# Patient Record
Sex: Male | Born: 1975 | Race: Black or African American | Hispanic: No | Marital: Single | Smoking: Never smoker
Health system: Southern US, Community
[De-identification: ages and names within clinical notes are randomized; demographics above are authoritative.]

## PROBLEM LIST (undated history)

## (undated) DIAGNOSIS — E119 Type 2 diabetes mellitus without complications: Secondary | ICD-10-CM

## (undated) DIAGNOSIS — I10 Essential (primary) hypertension: Secondary | ICD-10-CM

---

## 2018-12-02 ENCOUNTER — Other Ambulatory Visit: Payer: Self-pay

## 2018-12-02 ENCOUNTER — Emergency Department (HOSPITAL_COMMUNITY)
Admission: EM | Admit: 2018-12-02 | Discharge: 2018-12-03 | Disposition: A | Discharge status: Home / Self Care | Payer: Self-pay | Attending: Emergency Medicine | Admitting: Emergency Medicine

## 2018-12-02 DIAGNOSIS — Z5321 Procedure and treatment not carried out due to patient leaving prior to being seen by health care provider: Secondary | ICD-10-CM | POA: Insufficient documentation

## 2018-12-02 DIAGNOSIS — R630 Anorexia: Secondary | ICD-10-CM | POA: Insufficient documentation

## 2018-12-02 LAB — CBC WITH DIFFERENTIAL/PLATELET
Abs Immature Granulocytes: 0.02 10*3/uL (ref 0.00–0.07)
Basophils Absolute: 0 10*3/uL (ref 0.0–0.1)
Basophils Relative: 0 %
Eosinophils Absolute: 0 10*3/uL (ref 0.0–0.5)
Eosinophils Relative: 0 %
HCT: 45.6 % (ref 39.0–52.0)
Hemoglobin: 15.5 g/dL (ref 13.0–17.0)
Immature Granulocytes: 0 %
Lymphocytes Relative: 38 %
Lymphs Abs: 2.5 10*3/uL (ref 0.7–4.0)
MCH: 30 pg (ref 26.0–34.0)
MCHC: 34 g/dL (ref 30.0–36.0)
MCV: 88.4 fL (ref 80.0–100.0)
Monocytes Absolute: 0.8 10*3/uL (ref 0.1–1.0)
Monocytes Relative: 11 %
Neutro Abs: 3.3 10*3/uL (ref 1.7–7.7)
Neutrophils Relative %: 51 %
Platelets: 163 10*3/uL (ref 150–400)
RBC: 5.16 MIL/uL (ref 4.22–5.81)
RDW: 11 % — ABNORMAL LOW (ref 11.5–15.5)
WBC: 6.6 10*3/uL (ref 4.0–10.5)
nRBC: 0 % (ref 0.0–0.2)

## 2018-12-02 LAB — COMPREHENSIVE METABOLIC PANEL
ALT: 49 U/L — ABNORMAL HIGH (ref 0–44)
AST: 56 U/L — ABNORMAL HIGH (ref 15–41)
Albumin: 3.4 g/dL — ABNORMAL LOW (ref 3.5–5.0)
Alkaline Phosphatase: 34 U/L — ABNORMAL LOW (ref 38–126)
Anion gap: 14 (ref 5–15)
BUN: 27 mg/dL — ABNORMAL HIGH (ref 6–20)
CO2: 22 mmol/L (ref 22–32)
Calcium: 8.7 mg/dL — ABNORMAL LOW (ref 8.9–10.3)
Chloride: 97 mmol/L — ABNORMAL LOW (ref 98–111)
Creatinine, Ser: 2.94 mg/dL — ABNORMAL HIGH (ref 0.61–1.24)
GFR calc Af Amer: 29 mL/min — ABNORMAL LOW (ref >60)
GFR calc non Af Amer: 25 mL/min — ABNORMAL LOW (ref >60)
Glucose, Bld: 422 mg/dL — ABNORMAL HIGH (ref 70–99)
Potassium: 3.7 mmol/L (ref 3.5–5.1)
Sodium: 133 mmol/L — ABNORMAL LOW (ref 135–145)
Total Bilirubin: 0.8 mg/dL (ref 0.3–1.2)
Total Protein: 7 g/dL (ref 6.5–8.1)

## 2018-12-02 MED ORDER — ACETAMINOPHEN 500 MG PO TABS
1000.0000 mg | ORAL_TABLET | Freq: Once | ORAL | Status: AC
  Administered 2018-12-02: 18:00:00 1000 mg via ORAL
  Filled 2018-12-02: qty 2

## 2018-12-02 NOTE — ED Triage Notes (Signed)
Pt here for evaluation of generalized body aches and loss of appetite x 4-5 days. Denies sick contacts.

## 2018-12-03 ENCOUNTER — Inpatient Hospital Stay (HOSPITAL_COMMUNITY)
Admission: EM | Admit: 2018-12-03 | Discharge: 2018-12-07 | DRG: 177 | Disposition: A | Discharge status: Home / Self Care | Payer: HRSA Program | Source: Ambulatory Visit | Attending: Internal Medicine | Admitting: Internal Medicine

## 2018-12-03 ENCOUNTER — Emergency Department (HOSPITAL_COMMUNITY): Payer: HRSA Program

## 2018-12-03 ENCOUNTER — Encounter (HOSPITAL_COMMUNITY): Payer: Self-pay

## 2018-12-03 ENCOUNTER — Inpatient Hospital Stay (HOSPITAL_COMMUNITY): Payer: HRSA Program

## 2018-12-03 ENCOUNTER — Ambulatory Visit
Admission: EM | Admit: 2018-12-03 | Discharge: 2018-12-03 | Disposition: A | Discharge status: Home / Self Care | Payer: Self-pay

## 2018-12-03 ENCOUNTER — Other Ambulatory Visit: Payer: Self-pay

## 2018-12-03 DIAGNOSIS — R0902 Hypoxemia: Secondary | ICD-10-CM | POA: Diagnosis present

## 2018-12-03 DIAGNOSIS — E1122 Type 2 diabetes mellitus with diabetic chronic kidney disease: Secondary | ICD-10-CM | POA: Diagnosis not present

## 2018-12-03 DIAGNOSIS — J189 Pneumonia, unspecified organism: Secondary | ICD-10-CM

## 2018-12-03 DIAGNOSIS — E1129 Type 2 diabetes mellitus with other diabetic kidney complication: Secondary | ICD-10-CM | POA: Diagnosis present

## 2018-12-03 DIAGNOSIS — Z79899 Other long term (current) drug therapy: Secondary | ICD-10-CM | POA: Diagnosis not present

## 2018-12-03 DIAGNOSIS — E669 Obesity, unspecified: Secondary | ICD-10-CM | POA: Diagnosis present

## 2018-12-03 DIAGNOSIS — I10 Essential (primary) hypertension: Secondary | ICD-10-CM

## 2018-12-03 DIAGNOSIS — Z7984 Long term (current) use of oral hypoglycemic drugs: Secondary | ICD-10-CM

## 2018-12-03 DIAGNOSIS — J1289 Other viral pneumonia: Secondary | ICD-10-CM | POA: Diagnosis present

## 2018-12-03 DIAGNOSIS — N329 Bladder disorder, unspecified: Secondary | ICD-10-CM | POA: Diagnosis present

## 2018-12-03 DIAGNOSIS — N179 Acute kidney failure, unspecified: Secondary | ICD-10-CM

## 2018-12-03 DIAGNOSIS — N182 Chronic kidney disease, stage 2 (mild): Secondary | ICD-10-CM

## 2018-12-03 DIAGNOSIS — Z6839 Body mass index (BMI) 39.0-39.9, adult: Secondary | ICD-10-CM

## 2018-12-03 DIAGNOSIS — J9601 Acute respiratory failure with hypoxia: Secondary | ICD-10-CM

## 2018-12-03 DIAGNOSIS — U071 COVID-19: Secondary | ICD-10-CM

## 2018-12-03 DIAGNOSIS — E1165 Type 2 diabetes mellitus with hyperglycemia: Secondary | ICD-10-CM | POA: Diagnosis present

## 2018-12-03 DIAGNOSIS — J1282 Pneumonia due to coronavirus disease 2019: Secondary | ICD-10-CM

## 2018-12-03 HISTORY — DX: Essential (primary) hypertension: I10

## 2018-12-03 HISTORY — DX: Type 2 diabetes mellitus without complications: E11.9

## 2018-12-03 LAB — URINALYSIS, ROUTINE W REFLEX MICROSCOPIC
Bilirubin Urine: NEGATIVE
Glucose, UA: 50 mg/dL — AB
Hgb urine dipstick: NEGATIVE
Ketones, ur: NEGATIVE mg/dL
Leukocytes,Ua: NEGATIVE
Nitrite: NEGATIVE
Protein, ur: 100 mg/dL — AB
Specific Gravity, Urine: 1.019 (ref 1.005–1.030)
pH: 5 (ref 5.0–8.0)

## 2018-12-03 LAB — COMPREHENSIVE METABOLIC PANEL
ALT: 46 U/L — ABNORMAL HIGH (ref 0–44)
AST: 70 U/L — ABNORMAL HIGH (ref 15–41)
Albumin: 3.2 g/dL — ABNORMAL LOW (ref 3.5–5.0)
Alkaline Phosphatase: 34 U/L — ABNORMAL LOW (ref 38–126)
Anion gap: 12 (ref 5–15)
BUN: 38 mg/dL — ABNORMAL HIGH (ref 6–20)
CO2: 25 mmol/L (ref 22–32)
Calcium: 8.4 mg/dL — ABNORMAL LOW (ref 8.9–10.3)
Chloride: 94 mmol/L — ABNORMAL LOW (ref 98–111)
Creatinine, Ser: 3.25 mg/dL — ABNORMAL HIGH (ref 0.61–1.24)
GFR calc Af Amer: 26 mL/min — ABNORMAL LOW (ref >60)
GFR calc non Af Amer: 22 mL/min — ABNORMAL LOW (ref >60)
Glucose, Bld: 386 mg/dL — ABNORMAL HIGH (ref 70–99)
Potassium: 4.3 mmol/L (ref 3.5–5.1)
Sodium: 131 mmol/L — ABNORMAL LOW (ref 135–145)
Total Bilirubin: 1.4 mg/dL — ABNORMAL HIGH (ref 0.3–1.2)
Total Protein: 7 g/dL (ref 6.5–8.1)

## 2018-12-03 LAB — CBC WITH DIFFERENTIAL/PLATELET
Abs Immature Granulocytes: 0.03 10*3/uL (ref 0.00–0.07)
Basophils Absolute: 0 10*3/uL (ref 0.0–0.1)
Basophils Relative: 0 %
Eosinophils Absolute: 0 10*3/uL (ref 0.0–0.5)
Eosinophils Relative: 0 %
HCT: 45 % (ref 39.0–52.0)
Hemoglobin: 15.2 g/dL (ref 13.0–17.0)
Immature Granulocytes: 0 %
Lymphocytes Relative: 28 %
Lymphs Abs: 2.1 10*3/uL (ref 0.7–4.0)
MCH: 30.3 pg (ref 26.0–34.0)
MCHC: 33.8 g/dL (ref 30.0–36.0)
MCV: 89.8 fL (ref 80.0–100.0)
Monocytes Absolute: 0.7 10*3/uL (ref 0.1–1.0)
Monocytes Relative: 9 %
Neutro Abs: 4.5 10*3/uL (ref 1.7–7.7)
Neutrophils Relative %: 63 %
Platelets: 195 10*3/uL (ref 150–400)
RBC: 5.01 MIL/uL (ref 4.22–5.81)
RDW: 11.1 % — ABNORMAL LOW (ref 11.5–15.5)
WBC: 7.3 10*3/uL (ref 4.0–10.5)
nRBC: 0 % (ref 0.0–0.2)

## 2018-12-03 LAB — POCT FASTING CBG KUC MANUAL ENTRY: POCT Glucose (KUC): 371 mg/dL — AB (ref 70–99)

## 2018-12-03 LAB — FERRITIN: Ferritin: >7500 ng/mL — ABNORMAL HIGH (ref 24–336)

## 2018-12-03 LAB — C-REACTIVE PROTEIN: CRP: 4.9 mg/dL — ABNORMAL HIGH (ref <1.0)

## 2018-12-03 LAB — CBG MONITORING, ED
Glucose-Capillary: 356 mg/dL — ABNORMAL HIGH (ref 70–99)
Glucose-Capillary: 380 mg/dL — ABNORMAL HIGH (ref 70–99)

## 2018-12-03 LAB — SODIUM, URINE, RANDOM: Sodium, Ur: <10 mmol/L

## 2018-12-03 LAB — HEMOGLOBIN A1C
Hgb A1c MFr Bld: 14 % — ABNORMAL HIGH (ref 4.8–5.6)
Mean Plasma Glucose: 355.1 mg/dL

## 2018-12-03 LAB — CREATININE, URINE, RANDOM: Creatinine, Urine: 639.01 mg/dL

## 2018-12-03 LAB — TRIGLYCERIDES: Triglycerides: 239 mg/dL — ABNORMAL HIGH (ref <150)

## 2018-12-03 LAB — POC SARS CORONAVIRUS 2 AG -  ED: SARS Coronavirus 2 Ag: POSITIVE — AB

## 2018-12-03 LAB — LACTIC ACID, PLASMA: Lactic Acid, Venous: 1.4 mmol/L (ref 0.5–1.9)

## 2018-12-03 LAB — LACTATE DEHYDROGENASE: LDH: 543 U/L — ABNORMAL HIGH (ref 98–192)

## 2018-12-03 LAB — PROCALCITONIN: Procalcitonin: 0.13 ng/mL

## 2018-12-03 MED ORDER — DEXAMETHASONE SODIUM PHOSPHATE 10 MG/ML IJ SOLN
6.0000 mg | INTRAMUSCULAR | Status: DC
  Administered 2018-12-03 – 2018-12-05 (×3): 6 mg via INTRAVENOUS
  Filled 2018-12-03 (×3): qty 1

## 2018-12-03 MED ORDER — ONDANSETRON HCL 4 MG/2ML IJ SOLN
4.0000 mg | Freq: Once | INTRAMUSCULAR | Status: AC
  Administered 2018-12-03: 4 mg via INTRAVENOUS
  Filled 2018-12-03: qty 2

## 2018-12-03 MED ORDER — INSULIN ASPART 100 UNIT/ML ~~LOC~~ SOLN
0.0000 [IU] | Freq: Every day | SUBCUTANEOUS | Status: DC
  Administered 2018-12-03: 23:00:00 5 [IU] via SUBCUTANEOUS

## 2018-12-03 MED ORDER — ACETAMINOPHEN 325 MG PO TABS
650.0000 mg | ORAL_TABLET | Freq: Four times a day (QID) | ORAL | Status: DC | PRN
  Administered 2018-12-07: 650 mg via ORAL
  Filled 2018-12-03: qty 2

## 2018-12-03 MED ORDER — INSULIN ASPART 100 UNIT/ML ~~LOC~~ SOLN: 0.0000 [IU] | Freq: Three times a day (TID) | SUBCUTANEOUS | Status: DC

## 2018-12-03 MED ORDER — ACETAMINOPHEN 500 MG PO TABS
1000.0000 mg | ORAL_TABLET | Freq: Once | ORAL | Status: AC
  Administered 2018-12-03: 19:00:00 1000 mg via ORAL
  Filled 2018-12-03: qty 2

## 2018-12-03 MED ORDER — SODIUM CHLORIDE 0.9 % IV SOLN
100.0000 mg | INTRAVENOUS | Status: AC
  Administered 2018-12-04: 100 mg via INTRAVENOUS
  Filled 2018-12-03: qty 100
  Filled 2018-12-03: qty 20

## 2018-12-03 MED ORDER — ONDANSETRON HCL 4 MG/2ML IJ SOLN: 4.0000 mg | Freq: Four times a day (QID) | INTRAMUSCULAR | Status: DC | PRN

## 2018-12-03 MED ORDER — SODIUM CHLORIDE 0.9 % IV SOLN
200.0000 mg | Freq: Once | INTRAVENOUS | Status: AC
  Administered 2018-12-03: 200 mg via INTRAVENOUS
  Filled 2018-12-03: qty 40

## 2018-12-03 MED ORDER — SODIUM CHLORIDE 0.9 % IV BOLUS
500.0000 mL | Freq: Once | INTRAVENOUS | Status: AC
  Administered 2018-12-03: 500 mL via INTRAVENOUS

## 2018-12-03 MED ORDER — SODIUM CHLORIDE 0.9 % IV SOLN: Freq: Once | INTRAVENOUS | Status: DC

## 2018-12-03 MED ORDER — HYDROCODONE-ACETAMINOPHEN 5-325 MG PO TABS: 1.0000 | ORAL_TABLET | ORAL | Status: DC | PRN

## 2018-12-03 MED ORDER — GUAIFENESIN 100 MG/5ML PO SOLN
5.0000 mL | ORAL | Status: DC | PRN
  Filled 2018-12-03: qty 5

## 2018-12-03 NOTE — ED Triage Notes (Signed)
Pt c/o cough, bodyaches fatigue and abdominal pain that radiates to lt shoulder

## 2018-12-03 NOTE — ED Notes (Signed)
Pt transported to ED via EMS. Pt a/o x4 stable at this time. 93% 2L/O2/

## 2018-12-03 NOTE — ED Provider Notes (Addendum)
[facility] Provider Note   CSN: [id number] Arrival date & time: 12/03/18  1716     History   Chief Complaint Chief Complaint  Patient presents with  . Covid+/ fatigue    HPI Caleb Carrillo is a 43 y.o. male.      Fever Temp source:  Subjective Severity:  Moderate Onset quality:  Gradual Duration:  5 days Timing:  Constant Progression:  Worsening Chronicity:  New Relieved by:  None tried Worsened by:  Movement and exertion Ineffective treatments:  None tried Associated symptoms: chills, cough, diarrhea and myalgias   Associated symptoms: no chest pain, no congestion, no nausea and no vomiting   Cough:    Cough characteristics:  Productive   Sputum characteristics:  Nondescript   Severity:  Mild   Onset quality:  Gradual   Duration:  4 days   Timing:  Constant   Chronicity:  New   Presents today for cough, fever, decreased appetite Patient states that for the past 5 days he has had no appetite for his diabetes.  Patient denies any abdominal pain, nausea or pain with eating however states that he does not want to eat.  Patient is a diabetic and is taking Metformin.  States he has body aches as well.  Unsure of Covid exposure.  States that he feels more fatigued and at times short of breath.  States that cough has been persistent for panic symptoms.  Denies nausea vomiting but does have mild diarrhea.    Past Medical History:  Diagnosis Date  . Diabetes mellitus without complication (HCC)   . Hypertension     Patient Active Problem List   Diagnosis Date Noted  . COVID-19 virus infection 12/03/2018  . 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 Multifocal pneumonia 12/03/2018  . Hypertension     History reviewed. No pertinent surgical history.      Home Medications    Prior to Admission medications   Medication Sig Start Date End Date Taking? Authorizing Provider  hydrochlorothiazide  (HYDRODIURIL) 25 MG tablet Take 25 mg by mouth daily. 07/10/18   [provider]  lisinopril (ZESTRIL) 10 MG tablet Take 10 mg by mouth daily. 07/10/18   [provider]  metFORMIN (GLUCOPHAGE) 1000 MG tablet Take 1,000 mg by mouth 2 (two) times daily. 07/10/18   [provider]    Family History History reviewed. No pertinent family history.  Social History Social History   Tobacco Use  . Smoking status: Never Smoker  . Smokeless tobacco: Never Used  Substance Use Topics  . Alcohol use: Not Currently  . Drug use: Not Currently     Allergies   Patient has no known allergies.   Review of Systems Review of Systems  Constitutional: Positive for appetite change, chills, fatigue and fever.  HENT: Negative for congestion.   Respiratory: Positive for cough and shortness of breath.   Cardiovascular: Negative for chest pain.  Gastrointestinal: Positive for diarrhea. Negative for abdominal pain, nausea and vomiting.  Musculoskeletal: Positive for myalgias.  All other systems reviewed and are negative.    Physical Exam Updated Vital Signs BP 115/75 (BP Location: Left Arm)   Pulse (!) 103   Temp 99.3 F (37.4 C) (Oral)   Resp (!) 22   SpO2 90%   Physical Exam Vitals signs and nursing note reviewed.  Constitutional:      General: He is not in acute distress.    Comments: Patient  is lethargic however arousable.  Able answer questions appropriately.  Able to follow instructions.  Does not appear toxic does not appear in acute distress.  HENT:     Head: Normocephalic and atraumatic.     Nose: Nose normal.  Eyes:     General: No scleral icterus. Neck:     Musculoskeletal: Normal range of motion.  Cardiovascular:     Rate and Rhythm: Regular rhythm. Tachycardia present.     Pulses: Normal pulses.     Heart sounds: Normal heart sounds.  Pulmonary:     Effort: Pulmonary effort is normal. No respiratory distress.     Comments: Patient able to speak in  full sentences, no gross abnormalities on auscultation.  Some decreased air movement. Abdominal:     Palpations: Abdomen is soft.     Tenderness: There is no abdominal tenderness. There is no guarding or rebound.     Comments: Protuberant abdomen  Musculoskeletal:     Right lower leg: No edema.     Left lower leg: No edema.  Skin:    General: Skin is warm and dry.     Capillary Refill: Capillary refill takes less than 2 seconds.  Neurological:     Mental Status: He is alert. Mental status is at baseline.  Psychiatric:        Mood and Affect: Mood normal.        Behavior: Behavior normal.      ED Treatments / Results  Labs (all labs ordered are listed, but only abnormal results are displayed) Labs Reviewed  CBC WITH DIFFERENTIAL/PLATELET - Abnormal; Notable for the following components:      Result Value   RDW 11.1 (*)    All other components within normal limits  URINALYSIS, ROUTINE W REFLEX MICROSCOPIC - Abnormal; Notable for the following components:   Color, Urine AMBER (*)    APPearance HAZY (*)    Glucose, UA 50 (*)    Protein, ur 100 (*)    Bacteria, UA RARE (*)    All other components within normal limits  FERRITIN - Abnormal; Notable for the following components:   Ferritin >7,500 (*)    All other components within normal limits  TRIGLYCERIDES - Abnormal; Notable for the following components:   Triglycerides 239 (*)    All other components within normal limits  C-REACTIVE PROTEIN - Abnormal; Notable for the following components:   CRP 4.9 (*)    All other components within normal limits  COMPREHENSIVE METABOLIC PANEL - Abnormal; Notable for the following components:   Sodium 131 (*)    Chloride 94 (*)    Glucose, Bld 386 (*)    BUN 38 (*)    Creatinine, Ser 3.25 (*)    Calcium 8.4 (*)    Albumin 3.2 (*)    AST 70 (*)    ALT 46 (*)    Alkaline Phosphatase 34 (*)    Total Bilirubin 1.4 (*)    GFR calc non Af Amer 22 (*)    GFR calc Af Amer 26 (*)    All  other components within normal limits  LACTATE DEHYDROGENASE - Abnormal; Notable for the following components:   LDH 543 (*)    All other components within normal limits  CBG MONITORING, ED - Abnormal; Notable for the following components:   Glucose-Capillary 356 (*)    All other components within normal limits  CULTURE, BLOOD (ROUTINE X 2)  CULTURE, BLOOD (ROUTINE X 2)  LACTIC ACID, PLASMA  PROCALCITONIN  SODIUM, URINE, RANDOM  CREATININE, URINE, RANDOM  HEMOGLOBIN A1C    EKG EKG Interpretation  Date/Time:  Wednesday December 03 2018 18:50:35 EST Ventricular Rate:  105 PR Interval:    QRS Duration: 97 QT Interval:  343 QTC Calculation: 454 R Axis:   -82 Text Interpretation: Sinus tachycardia LAD, consider left anterior fascicular block Probable anteroseptal infarct, old Baseline wander in lead(s) V2 unchanged from prior today Confirmed by Meridee Score [phone]) on 12/03/2018 6:58:48 PM   Radiology Dg Chest Portable 1 View  Result Date: 12/03/2018 CLINICAL DATA:  Chest pain with 4 days of cough, body aches, fatigue and abdominal pain EXAM: PORTABLE CHEST 1 VIEW COMPARISON:  None FINDINGS: Multifocal areas of patchy airspace consolidation most pronounced in the left lung periphery. No pneumothorax or effusion. The cardiomediastinal contours are unremarkable. No acute osseous or soft tissue abnormality. Degenerative changes are present in the imaged spine and shoulders. IMPRESSION: Findings concerning for a multifocal pneumonia in the appropriate clinical setting. Electronically Signed   By: Kreg Shropshire M.D.   On: 12/03/2018 18:42    Procedures Procedures (including critical care time)  Medications Ordered in ED Medications  insulin aspart (novoLOG) injection 0-9 Units (has no administration in time range)  insulin aspart (novoLOG) injection 0-5 Units (has no administration in time range)  acetaminophen (TYLENOL) tablet 650 mg (has no administration in time range)   HYDROcodone-acetaminophen (NORCO/VICODIN) 5-325 MG per tablet 1-2 tablet (has no administration in time range)  ondansetron (ZOFRAN) injection 4 mg (has no administration in time range)  guaiFENesin (ROBITUSSIN) 100 MG/5ML solution 100 mg (has no administration in time range)  dexamethasone (DECADRON) injection 6 mg (has no administration in time range)  ondansetron (ZOFRAN) injection 4 mg (4 mg Intravenous Given 12/03/18 1903)  acetaminophen (TYLENOL) tablet 1,000 mg (1,000 mg Oral Given 12/03/18 1903)  sodium chloride 0.9 % bolus 500 mL (0 mLs Intravenous Stopped 12/03/18 2033)     Initial Impression / Assessment and Plan / ED Course  I have reviewed the triage vital signs and the nursing notes.  Pertinent labs & imaging results that were available during my care of the patient were reviewed by me and considered in my medical decision making (see chart for details).        Patient is 43 year old male of hypertension and diabetes that he states is well controlled.  States he has had decreased appetite for the past 5 days.  UA shows protein and glucose.  CBC without abnormality, CMP with multiple electrolyte abnormalities and elevated glucose of 386.  BUN and creatinine are elevated.  Negative urine ketones and no anion gap.  Doubt the patient is experiencing DKA at this time.  EKG without findings indicating acute ischemia mild tachycardia.  Chest x-ray reviewed by me shows multifocal consolidations likely Covid pneumonia.  Patient intermittently desaturates to 88 to 90% at rest.  Was ambulated and was O2 saturation dropped only to 92% however chest x-ray concerning for advanced Covid infection along with concern for extreme dehydration considering tachycardia and AKI.  Creatinine elevated, concern for chronic kidney disease.  Patient states he has no history of such.  States that his diabetes and hypertension are well managed with hydrochlorothiazide, lisinopril, metformin.  Consulted Dr.  Antionette Char who will accept this patient for admission.     Final Clinical Impressions(s) / ED Diagnoses   Final diagnoses:  COVID-19  Hypoxia  AKI (acute kidney injury) [facility])    ED Discharge Orders    None  Gailen Shelter, [place] 12/03/18 2118    Gailen Shelter, [place] 12/04/18 0944    Terrilee Files, MD 12/04/18 [phone]

## 2018-12-03 NOTE — ED Provider Notes (Signed)
[facility]    CSN: [id number] Arrival date & time: 12/03/18  1535      History   Chief Complaint Chief Complaint  Patient presents with  . Cough    HPI Caleb Carrillo is a 43 y.o. male.   43 year old male with history of diabetes, hypertension comes in for 6-day history of cough, body aches, fatigue, abdominal pain.  Cough has been nonproductive, denies other URI symptoms such as rhinorrhea, nasal congestion, sore throat.  Has had chills and body aches without known fever.  He denies chest pain, shortness of breath.  States abdominal pain is left lower quadrant, and can radiate to the left shoulder, which is intermittent without obvious aggravating or alleviating factor.  Denies nausea, vomiting.  Has had mild diarrhea.  Has had loss of taste/smell.  Never smoker.  Patient was at the ED yesterday, blood work obtained, but left prior to being seen.  CMP shows AKI with creatinine 2.59, increased from 1.5 1 year ago.      Past Medical History:  Diagnosis Date  . Diabetes mellitus without complication (HCC)   . Hypertension     There are no active problems to display for this patient.   History reviewed. No pertinent surgical history.     Home Medications    Prior to Admission medications   Medication Sig Start Date End Date Taking? Authorizing Provider  metFORMIN (GLUCOPHAGE) 500 MG tablet Take by mouth 2 (two) times daily with a meal.   Yes [provider]    Family History No family history on file.  Social History Social History   Tobacco Use  . Smoking status: Never Smoker  . Smokeless tobacco: Never Used  Substance Use Topics  . Alcohol use: Not Currently  . Drug use: Not Currently     Allergies   Patient has no known allergies.   Review of Systems Review of Systems  Reason unable to perform ROS: See HPI as above.     Physical Exam Triage Vital Signs ED Triage Vitals [12/03/18 1558]  Enc Vitals Group     BP 111/75      Pulse Rate (!) 112     Resp 18     Temp 99.6 F (37.6 C)     Temp Source Oral     SpO2 90 %     Weight      Height      Head Circumference      Peak Flow      Pain Score 7     Pain Loc      Pain Edu?      Excl. in GC?    No data found.  Updated Vital Signs BP 111/75 (BP Location: Left Arm)   Pulse (!) 112   Temp 99.6 F (37.6 C) (Oral)   Resp 18   SpO2 90%   Physical Exam Constitutional:      General: He is in acute distress.     Appearance: Normal appearance. He is ill-appearing. He is not toxic-appearing or diaphoretic.     Comments: Fatigued but arousable  HENT:     Head: Normocephalic and atraumatic.     Mouth/Throat:     Mouth: Mucous membranes are moist.     Pharynx: Oropharynx is clear. Uvula midline.  Neck:     Musculoskeletal: Normal range of motion and neck supple.  Cardiovascular:     Rate and Rhythm: Regular rhythm. Tachycardia present.     Heart sounds: Normal  heart sounds. No murmur. No friction rub. No gallop.   Pulmonary:     Effort: Pulmonary effort is normal. No accessory muscle usage, prolonged expiration, respiratory distress or retractions.     Comments: Speaking in full sentences without obvious difficulty. Lungs clear to auscultation without adventitious lung sounds. Abdominal:     General: Bowel sounds are normal.     Palpations: Abdomen is soft.     Tenderness: There is no abdominal tenderness. There is no right CVA tenderness, left CVA tenderness, guarding or rebound.  Skin:    General: Skin is warm and dry.  Neurological:     General: No focal deficit present.     Mental Status: He is alert and oriented to person, place, and time.      UC Treatments / Results  Labs (all labs ordered are listed, but only abnormal results are displayed) Labs Reviewed  POC SARS CORONAVIRUS 2 ED - Abnormal; Notable for the following components:      Result Value   SARS Coronavirus 2 Ag Positive (*)    All other components within normal limits   POCT FASTING CBG [facility] MANUAL ENTRY - Abnormal; Notable for the following components:   POCT Glucose ([facility]) 371 (*)    All other components within normal limits    EKG   Radiology No results found.  Procedures Procedures (including critical care time)  Medications Ordered in UC Medications - No data to display  Initial Impression / Assessment and Plan / UC Course  I have reviewed the triage vital signs and the nursing notes.  Pertinent labs & imaging results that were available during my care of the patient were reviewed by me and considered in my medical decision making (see chart for details).    43 year old male comes in for 6-day history of cough, body aches, fatigue, left lower quadrant abdominal pain.  He denies chest pain, shortness of breath.  Has had loss of taste and smell.  He was at the emergency department 12/02/2018, by left before being seen.  At that time, CMP shows creatinine of 2.59, increased from 1.5 1-year ago.   Upon arrival, patient was tachycardic, hypoxic at 90% on room air.  Temp 99.6.  He was fatigued, though arousable.  Lungs are clear to auscultation bilaterally without adventitious lung sounds.  However, patient's O2 sat dropped to 86% at times.  Patient put on 2 L oxygen.  Rapid Covid positive.  CBG 371.  Given hypoxia, fatigue, tachycardia, will have patient transported via EMS to the ED for further evaluation and management needed.  Final Clinical Impressions(s) / UC Diagnoses   Final diagnoses:  COVID-19  Acute respiratory failure with hypoxia ([place])  AKI (acute kidney injury) [facility])    ED Prescriptions    None     PDMP not reviewed this encounter.   Ok Edwards, PA-C 12/03/18 1637

## 2018-12-03 NOTE — ED Notes (Signed)
While ambulating around the bed x5, pts O2 ranged from 94 to 92%. Asked questions to see if it dropped while ambulating and answering, never below 92%, RA

## 2018-12-03 NOTE — Discharge Instructions (Addendum)
43 year old male comes in for 6-day history of cough, body aches, fatigue, left lower quadrant abdominal pain.  He denies chest pain, shortness of breath.  Has had loss of taste and smell.  He was at the emergency department 12/02/2018, by left before being seen.  At that time, CMP shows creatinine of 2.59, increased from 1.5 1-year ago.   Upon arrival, patient was tachycardic, hypoxic at 90% on room air.  Temp 99.6.  He was fatigued, though arousable.  Lungs are clear to auscultation bilaterally without adventitious lung sounds.  However, patient's O2 sat dropped to 86% at times.  Patient put on 2 L oxygen.  Rapid Covid positive.  CBG 371.  Given hypoxia, fatigue, tachycardia, will have patient transported via EMS to the ED for further evaluation and management needed.

## 2018-12-03 NOTE — H&P (Signed)
History and Physical    Caleb Carrillo UMP:[id number] DOB: 26-Jan-1975 DOA: 12/03/2018  PCP: System, Pcp Not In   Patient coming from: home   Chief Complaint: Cough, aches, anorexia, loose stools   HPI: Caleb Carrillo is a 43 y.o. male with medical history significant for type 2 diabetes mellitus and hypertension, presenting to the emergency department for evaluation of cough, aches, loss of appetite, and loose stools.  Patient reports that he been in his usual state of health until approximately 5 days ago when he developed the aforementioned symptoms.  He was not aware of any sick contacts.  He has been experiencing subjective fevers.  He is not eating or drinking much of anything over the past 3 days due to loss of appetite.  He denies abdominal pain.  Denies vomiting.  Denies chest pain.  Reports continued adherence with his medications including lisinopril and HCTZ.  He was evaluated for these complaints at an urgent care, found to be positive for COVID-19, tachycardic, hypoxic, and was directed to the ED for further evaluation.  ED Course: Upon arrival to the ED, patient is found to be febrile to 38.2 C, saturating 90% on room air while at rest, mildly tachypneic, tachycardic in the 110s, and with stable blood pressure.  EKG features sinus tachycardia and chest x-ray is concerning for multifocal pneumonia.  Chemistry panel is notable for glucose 386, mild elevation in transaminases, and creatinine 3.25, up from 2.94 yesterday and 1.5 a year ago.  CBC is unremarkable and lactic acid reassuringly normal.  Blood cultures were collected in the emergency department and the patient was treated with 500 cc normal saline, Zofran, and acetaminophen.  Review of Systems:  All other systems reviewed and apart from HPI, are negative.  Past Medical History:  Diagnosis Date  . Diabetes mellitus without complication (HCC)   . Hypertension     History reviewed. No pertinent surgical history.   reports  that he has never smoked. He has never used smokeless tobacco. He reports previous alcohol use. He reports previous drug use.  No Known Allergies  History reviewed. No pertinent family history.   Prior to Admission medications   Medication Sig Start Date End Date Taking? Authorizing Provider  hydrochlorothiazide (HYDRODIURIL) 25 MG tablet Take 25 mg by mouth daily. 07/10/18   [provider]  lisinopril (ZESTRIL) 10 MG tablet Take 10 mg by mouth daily. 07/10/18   [provider]  metFORMIN (GLUCOPHAGE) 1000 MG tablet Take 1,000 mg by mouth 2 (two) times daily. 07/10/18   [provider]    Physical Exam: Vitals:   12/03/18 1724 12/03/18 1725 12/03/18 1813 12/03/18 2057  BP: 115/75     Pulse: (!) 103     Resp: (!) 22     Temp: (!) 100.8 F (38.2 C)   99.3 F (37.4 C)  TempSrc: Oral   Oral  SpO2: 90% 97% 90%     Constitutional: NAD, mild tachypnea at rest Eyes: PERTLA, lids and conjunctivae normal ENMT: Mucous membranes are moist. Posterior pharynx clear of any exudate or lesions.   Neck: normal, supple, no masses, no thyromegaly Respiratory: Scattered rhonchi bilaterally. Tachypnea. Speaking full sentences.  Cardiovascular: S1 & S2 heard, regular rate and rhythm. No extremity edema.  Abdomen: No distension, no tenderness, soft. Bowel sounds active.  Musculoskeletal: no clubbing / cyanosis. No joint deformity upper and lower extremities.  Skin: no significant rashes, lesions, ulcers. Warm, dry, well-perfused. Neurologic: No facial asymmetry. Sensation intact. Moving all  extremities.  Psychiatric: Alert and oriented to person, place, and situation. Calm, cooperative.     Labs on Admission: I have personally reviewed following labs and imaging studies  CBC: Recent Labs  Lab 12/02/18 1830 12/03/18 1932  WBC 6.6 7.3  NEUTROABS 3.3 4.5  HGB 15.5 15.2  HCT 45.6 45.0  MCV 88.4 89.8  PLT 163 195   Basic Metabolic Panel: Recent Labs  Lab 12/02/18  1830 12/03/18 1920  NA 133* 131*  K 3.7 4.3  CL 97* 94*  CO2 22 25  GLUCOSE 422* 386*  BUN 27* 38*  CREATININE 2.94* 3.25*  CALCIUM 8.7* 8.4*   GFR: CrCl cannot be calculated (Unknown ideal weight.). Liver Function Tests: Recent Labs  Lab 12/02/18 1830 12/03/18 1920  AST 56* 70*  ALT 49* 46*  ALKPHOS 34* 34*  BILITOT 0.8 1.4*  PROT 7.0 7.0  ALBUMIN 3.4* 3.2*   No results for input(s): LIPASE, AMYLASE in the last 168 hours. No results for input(s): AMMONIA in the last 168 hours. Coagulation Profile: No results for input(s): INR, PROTIME in the last 168 hours. Cardiac Enzymes: No results for input(s): CKTOTAL, CKMB, CKMBINDEX, TROPONINI in the last 168 hours. BNP (last 3 results) No results for input(s): PROBNP in the last 8760 hours. HbA1C: No results for input(s): HGBA1C in the last 72 hours. CBG: Recent Labs  Lab 12/03/18 1734  GLUCAP 356*   Lipid Profile: Recent Labs    12/03/18 1920  TRIG 239*   Thyroid Function Tests: No results for input(s): TSH, T4TOTAL, FREET4, T3FREE, THYROIDAB in the last 72 hours. Anemia Panel: Recent Labs    12/03/18 1920  FERRITIN >7,500*   Urine analysis:    Component Value Date/Time   COLORURINE AMBER (A) 12/03/2018 2020   APPEARANCEUR HAZY (A) 12/03/2018 2020   LABSPEC 1.019 12/03/2018 2020   PHURINE 5.0 12/03/2018 2020   GLUCOSEU 50 (A) 12/03/2018 2020   HGBUR NEGATIVE 12/03/2018 2020   BILIRUBINUR NEGATIVE 12/03/2018 2020   KETONESUR NEGATIVE 12/03/2018 2020   PROTEINUR 100 (A) 12/03/2018 2020   NITRITE NEGATIVE 12/03/2018 2020   LEUKOCYTESUR NEGATIVE 12/03/2018 2020   Sepsis Labs: @LABRCNTIP (procalcitonin:4,lacticidven:4) )No results found for this or any previous visit (from the past 240 hour(s)).   Radiological Exams on Admission: Dg Chest Portable 1 View  Result Date: 12/03/2018 CLINICAL DATA:  Chest pain with 4 days of cough, body aches, fatigue and abdominal pain EXAM: PORTABLE CHEST 1 VIEW  COMPARISON:  None FINDINGS: Multifocal areas of patchy airspace consolidation most pronounced in the left lung periphery. No pneumothorax or effusion. The cardiomediastinal contours are unremarkable. No acute osseous or soft tissue abnormality. Degenerative changes are present in the imaged spine and shoulders. IMPRESSION: Findings concerning for a multifocal pneumonia in the appropriate clinical setting. Electronically Signed   By: Kreg ShropshirePrice  DeHay M.D.   On: 12/03/2018 18:42    EKG: Independently reviewed. Sinus tachycardia (rate 103), QTc 443 ms.   Assessment/Plan   1. COVID-19 pneumonia  - Presents with 5 days of malaise, f/c, cough, anorexia, and loose stools, and is found to be febrile with tachycardia and tachypnea, CXR concerning for multifocal PNA, and positive for COVID-19  - O2 saturations 90% at rest in ED and supplemental O2 was started  - Start Decadron and remdesivir, continue supplemental O2 as needed, check procalcitonin and consider antibiotics if elevated, trend inflammatory markers, continue isolation    2. Acute kidney injury  - SCr is 3.25 on admission, up from 2.94 the day  before and 1.5 in 2019  - Likely acute prerenal azotemia in setting of anorexia, loose stool, low BP, and continued diuretic use, as well as concomitant ACE inhibition  - There is no acidosis or hyperkalemia  - Hold HCTZ and lisinopril, check FENa and renal US, renally-dose medications, continue IVF hydration, repeat chem panel in am    3. Type II DM  - A1c was 7.6% in May 2019  - Managed with metformin at home, held on admission  - Check CBG's and use a low-intensity SSI with Novolog while in hospital   4. Hypertension  - SBP low 90's initially, improved with IVF - Hold lisinopril and HCTZ in light of AKI   5. Elevated transaminases  - AST is 70 and ALT 46 on admission  - Abdominal exam is benign and this is likely secondary to COVID-19, will trend     PPE: CAPR, gown, gloves  DVT prophylaxis:  sq heparin  Code Status: Full  Family Communication: Discussed with patient  Consults called: None  Admission status: Inpatient. The patient has multiple acute problems related to COVID-19, including acute renal failure and multifocal PNA with new supplemental O2 requirement. Both of these have been worsening and each expected to require hospital-based management that will exceed the observation timeframe.     Vianne Bulls, MD [facility] Pager [phone]  If 7PM-7AM, please contact night-coverage [email or web address] Password [email or web address]  12/03/2018, 9:18 PM

## 2018-12-04 DIAGNOSIS — U071 COVID-19: Principal | ICD-10-CM

## 2018-12-04 LAB — COMPREHENSIVE METABOLIC PANEL
ALT: 44 U/L (ref 0–44)
AST: 54 U/L — ABNORMAL HIGH (ref 15–41)
Albumin: 3.2 g/dL — ABNORMAL LOW (ref 3.5–5.0)
Alkaline Phosphatase: 34 U/L — ABNORMAL LOW (ref 38–126)
Anion gap: 13 (ref 5–15)
BUN: 41 mg/dL — ABNORMAL HIGH (ref 6–20)
CO2: 23 mmol/L (ref 22–32)
Calcium: 8.3 mg/dL — ABNORMAL LOW (ref 8.9–10.3)
Chloride: 98 mmol/L (ref 98–111)
Creatinine, Ser: 3.01 mg/dL — ABNORMAL HIGH (ref 0.61–1.24)
GFR calc Af Amer: 28 mL/min — ABNORMAL LOW (ref >60)
GFR calc non Af Amer: 25 mL/min — ABNORMAL LOW (ref >60)
Glucose, Bld: 429 mg/dL — ABNORMAL HIGH (ref 70–99)
Potassium: 4.3 mmol/L (ref 3.5–5.1)
Sodium: 134 mmol/L — ABNORMAL LOW (ref 135–145)
Total Bilirubin: 1 mg/dL (ref 0.3–1.2)
Total Protein: 7.2 g/dL (ref 6.5–8.1)

## 2018-12-04 LAB — CBC WITH DIFFERENTIAL/PLATELET
Abs Immature Granulocytes: 0.03 10*3/uL (ref 0.00–0.07)
Basophils Absolute: 0 10*3/uL (ref 0.0–0.1)
Basophils Relative: 0 %
Eosinophils Absolute: 0 10*3/uL (ref 0.0–0.5)
Eosinophils Relative: 0 %
HCT: 44.6 % (ref 39.0–52.0)
Hemoglobin: 15.1 g/dL (ref 13.0–17.0)
Immature Granulocytes: 0 %
Lymphocytes Relative: 17 %
Lymphs Abs: 1.2 10*3/uL (ref 0.7–4.0)
MCH: 30.3 pg (ref 26.0–34.0)
MCHC: 33.9 g/dL (ref 30.0–36.0)
MCV: 89.6 fL (ref 80.0–100.0)
Monocytes Absolute: 0.5 10*3/uL (ref 0.1–1.0)
Monocytes Relative: 7 %
Neutro Abs: 5.3 10*3/uL (ref 1.7–7.7)
Neutrophils Relative %: 76 %
Platelets: 181 10*3/uL (ref 150–400)
RBC: 4.98 MIL/uL (ref 4.22–5.81)
RDW: 11.1 % — ABNORMAL LOW (ref 11.5–15.5)
WBC: 7 10*3/uL (ref 4.0–10.5)
nRBC: 0 % (ref 0.0–0.2)

## 2018-12-04 LAB — GLUCOSE, CAPILLARY
Glucose-Capillary: 169 mg/dL — ABNORMAL HIGH (ref 70–99)
Glucose-Capillary: 141 mg/dL — ABNORMAL HIGH (ref 70–99)
Glucose-Capillary: 197 mg/dL — ABNORMAL HIGH (ref 70–99)
Glucose-Capillary: 220 mg/dL — ABNORMAL HIGH (ref 70–99)
Glucose-Capillary: 190 mg/dL — ABNORMAL HIGH (ref 70–99)
Glucose-Capillary: 188 mg/dL — ABNORMAL HIGH (ref 70–99)

## 2018-12-04 LAB — CBG MONITORING, ED
Glucose-Capillary: 511 mg/dL (ref 70–99)
Glucose-Capillary: 513 mg/dL (ref 70–99)
Glucose-Capillary: 448 mg/dL — ABNORMAL HIGH (ref 70–99)
Glucose-Capillary: 552 mg/dL (ref 70–99)
Glucose-Capillary: 431 mg/dL — ABNORMAL HIGH (ref 70–99)
Glucose-Capillary: 413 mg/dL — ABNORMAL HIGH (ref 70–99)
Glucose-Capillary: 288 mg/dL — ABNORMAL HIGH (ref 70–99)
Glucose-Capillary: 230 mg/dL — ABNORMAL HIGH (ref 70–99)

## 2018-12-04 LAB — ABO/RH: ABO/RH(D): O POS

## 2018-12-04 LAB — BASIC METABOLIC PANEL
Anion gap: 13 (ref 5–15)
BUN: 47 mg/dL — ABNORMAL HIGH (ref 6–20)
CO2: 23 mmol/L (ref 22–32)
Calcium: 8.5 mg/dL — ABNORMAL LOW (ref 8.9–10.3)
Chloride: 100 mmol/L (ref 98–111)
Creatinine, Ser: 2.69 mg/dL — ABNORMAL HIGH (ref 0.61–1.24)
GFR calc Af Amer: 33 mL/min — ABNORMAL LOW (ref >60)
GFR calc non Af Amer: 28 mL/min — ABNORMAL LOW (ref >60)
Glucose, Bld: 233 mg/dL — ABNORMAL HIGH (ref 70–99)
Potassium: 3.7 mmol/L (ref 3.5–5.1)
Sodium: 136 mmol/L (ref 135–145)

## 2018-12-04 LAB — C-REACTIVE PROTEIN: CRP: 5.2 mg/dL — ABNORMAL HIGH (ref <1.0)

## 2018-12-04 LAB — MAGNESIUM: Magnesium: 2.2 mg/dL (ref 1.7–2.4)

## 2018-12-04 LAB — PHOSPHORUS: Phosphorus: 3.9 mg/dL (ref 2.5–4.6)

## 2018-12-04 LAB — D-DIMER, QUANTITATIVE: D-Dimer, Quant: 0.88 ug/mL-FEU — ABNORMAL HIGH (ref 0.00–0.50)

## 2018-12-04 LAB — HIV ANTIBODY (ROUTINE TESTING W REFLEX): HIV Screen 4th Generation wRfx: NONREACTIVE

## 2018-12-04 LAB — FERRITIN: Ferritin: >7500 ng/mL — ABNORMAL HIGH (ref 24–336)

## 2018-12-04 LAB — PROCALCITONIN: Procalcitonin: <0.1 ng/mL

## 2018-12-04 LAB — HEPATITIS B SURFACE ANTIGEN: Hepatitis B Surface Ag: NONREACTIVE

## 2018-12-04 MED ORDER — DEXTROSE 50 % IV SOLN
0.0000 mL | INTRAVENOUS | Status: DC | PRN
  Filled 2018-12-04: qty 50

## 2018-12-04 MED ORDER — SODIUM CHLORIDE 0.9 % IV SOLN
INTRAVENOUS | Status: DC
  Administered 2018-12-04: 14:00:00 via INTRAVENOUS

## 2018-12-04 MED ORDER — INSULIN ASPART 100 UNIT/ML ~~LOC~~ SOLN
0.0000 [IU] | Freq: Every day | SUBCUTANEOUS | Status: DC
  Administered 2018-12-05: 4 [IU] via SUBCUTANEOUS
  Administered 2018-12-06: 3 [IU] via SUBCUTANEOUS

## 2018-12-04 MED ORDER — SODIUM CHLORIDE 0.9 % IV SOLN
100.0000 mg | Freq: Every day | INTRAVENOUS | Status: AC
  Administered 2018-12-05 – 2018-12-07 (×3): 100 mg via INTRAVENOUS
  Filled 2018-12-04 (×3): qty 100

## 2018-12-04 MED ORDER — SODIUM CHLORIDE 0.9 % IV SOLN
INTRAVENOUS | Status: DC
  Administered 2018-12-04: 19:00:00 via INTRAVENOUS

## 2018-12-04 MED ORDER — INSULIN ASPART 100 UNIT/ML ~~LOC~~ SOLN
0.0000 [IU] | Freq: Three times a day (TID) | SUBCUTANEOUS | Status: DC
  Administered 2018-12-05: 20 [IU] via SUBCUTANEOUS
  Administered 2018-12-05: 11 [IU] via SUBCUTANEOUS
  Administered 2018-12-05: 20 [IU] via SUBCUTANEOUS
  Administered 2018-12-06: 15 [IU] via SUBCUTANEOUS
  Administered 2018-12-06: 11 [IU] via SUBCUTANEOUS
  Administered 2018-12-06: 15 [IU] via SUBCUTANEOUS
  Administered 2018-12-07: 08:00:00 3 [IU] via SUBCUTANEOUS
  Administered 2018-12-07: 7 [IU] via SUBCUTANEOUS

## 2018-12-04 MED ORDER — INSULIN GLARGINE 100 UNIT/ML ~~LOC~~ SOLN
16.0000 [IU] | Freq: Two times a day (BID) | SUBCUTANEOUS | Status: DC
  Administered 2018-12-04: 16 [IU] via SUBCUTANEOUS
  Filled 2018-12-04 (×2): qty 0.16

## 2018-12-04 MED ORDER — INSULIN ASPART 100 UNIT/ML ~~LOC~~ SOLN: 0.0000 [IU] | Freq: Three times a day (TID) | SUBCUTANEOUS | Status: DC

## 2018-12-04 MED ORDER — INSULIN ASPART 100 UNIT/ML ~~LOC~~ SOLN: 15.0000 [IU] | Freq: Once | SUBCUTANEOUS | Status: DC

## 2018-12-04 MED ORDER — INSULIN REGULAR(HUMAN) IN NACL 100-0.9 UT/100ML-% IV SOLN
INTRAVENOUS | Status: AC
  Administered 2018-12-04: 14 [IU]/h via INTRAVENOUS
  Filled 2018-12-04: qty 100

## 2018-12-04 MED ORDER — INSULIN ASPART 100 UNIT/ML ~~LOC~~ SOLN
10.0000 [IU] | Freq: Once | SUBCUTANEOUS | Status: AC
  Administered 2018-12-04: 10 [IU] via SUBCUTANEOUS

## 2018-12-04 MED ORDER — INSULIN GLARGINE 100 UNIT/ML ~~LOC~~ SOLN
10.0000 [IU] | Freq: Every day | SUBCUTANEOUS | Status: DC
  Administered 2018-12-04: 10 [IU] via SUBCUTANEOUS
  Filled 2018-12-04: qty 0.1

## 2018-12-04 MED ORDER — DEXTROSE-NACL 5-0.45 % IV SOLN
INTRAVENOUS | Status: DC
  Administered 2018-12-04: 17:00:00 via INTRAVENOUS

## 2018-12-04 MED ORDER — LACTATED RINGERS IV SOLN
INTRAVENOUS | Status: DC
  Administered 2018-12-04: 12:00:00 via INTRAVENOUS

## 2018-12-04 MED ORDER — HEPARIN SODIUM (PORCINE) 5000 UNIT/ML IJ SOLN
5000.0000 [IU] | Freq: Three times a day (TID) | INTRAMUSCULAR | Status: DC
  Administered 2018-12-04 – 2018-12-05 (×3): 5000 [IU] via SUBCUTANEOUS
  Filled 2018-12-04 (×3): qty 1

## 2018-12-04 MED ORDER — INSULIN ASPART 100 UNIT/ML ~~LOC~~ SOLN
0.0000 [IU] | SUBCUTANEOUS | Status: DC
  Administered 2018-12-04: 10 [IU] via SUBCUTANEOUS
  Administered 2018-12-04: 20 [IU] via SUBCUTANEOUS

## 2018-12-04 NOTE — Progress Notes (Signed)
Brief transfer note:  43 year old-2, HTN-who presented with cough, diarrhea-mild hypoxemia in the setting of COVID-19 pneumonia along with AKI.  Transferred from [facility] to [facility] this afternoon.  Exam: Lying comfortably in bed Lungs: Appear clear to auscultation-no added sounds.  Impression: Mild hypoxia secondary to COVID-19 AKI-likely hemodynamically mediated due to diarrhea, ACE inhibitor  Plan: Continue steroids/remdesivir/gentle hydration Labs reordered for tomorrow Rest as outlined by Dr. Florene Glen

## 2018-12-04 NOTE — ED Notes (Signed)
ED TO INPATIENT HANDOFF REPORT  ED Nurse Name and Phone #: Janayla Marik [phone]  S Name/Age/Gender Caleb RodriguezMichel A Carrillo 43 y.o. male Room/Bed: 011C/011C  Code Status   Code Status: Full Code  Home/SNF/Other Home Patient oriented to: self, place and situation Is this baseline? Yes   Triage Complete: Triage complete  Chief Complaint COVID POSITIVE Fatigue  Triage Note No notes on file   Allergies No Known Allergies  Level of Care/Admitting Diagnosis ED Disposition    ED Disposition Condition Comment   Admit  [facility] [[id number]]  Level of Care: Med-Surg [16]  Covid Evaluation: Confirmed COVID Positive  Diagnosis: COVID-19 virus infection [[id number]][[phone]]  Admitting Physician: Briscoe DeutscherPYD, TIMOTHY S [[id number]]  Attending Physician: Briscoe DeutscherPYD, TIMOTHY S [[id number]]  Estimated length of stay: past midnight tomorrow  Certification:: I certify this patient will need inpatient services for at least 2 midnights  PT Class (Do Not Modify): Inpatient [101]  PT Acc Code (Do Not Modify): Private [1]       B Medical/Surgery History Past Medical History:  Diagnosis Date  . Diabetes mellitus without complication (HCC)   . Hypertension    History reviewed. No pertinent surgical history.   A IV Location/Drains/Wounds Patient Lines/Drains/Airways Status   Active Line/Drains/Airways    Name:   Placement date:   Placement time:   Site:   Days:   Peripheral IV 12/03/18 Right Antecubital   12/03/18    1638    Antecubital   1   Peripheral IV 12/03/18 Left Antecubital   12/03/18    1915    Antecubital   1          Intake/Output Last 24 hours  Intake/Output Summary (Last 24 hours) at 12/04/2018 1405 Last data filed at 12/04/2018 [id number] Gross per 24 hour  Intake 500 ml  Output 400 ml  Net 100 ml    Labs/Imaging Results for orders placed or performed during the hospital encounter of 12/03/18 (from the past 48 hour(s))  CBG monitoring, ED     Status: Abnormal    Collection Time: 12/03/18  5:34 PM  Result Value Ref Range   Glucose-Capillary 356 (H) 70 - 99 mg/dL  Blood Culture (routine x 2)     Status: None (Preliminary result)   Collection Time: 12/03/18  7:15 PM   Specimen: BLOOD LEFT HAND  Result Value Ref Range   Specimen Description BLOOD LEFT HAND    Special Requests      BOTTLES DRAWN AEROBIC AND ANAEROBIC Blood Culture adequate volume   Culture      NO GROWTH < 24 HOURS Performed at [facility], [address] [place], [place] [address]    Report Status PENDING   Ferritin     Status: Abnormal   Collection Time: 12/03/18  7:20 PM  Result Value Ref Range   Ferritin >7,500 (H) 24 - 336 ng/mL    Comment: Performed at [facility], [address] [place], [place] [address]  Triglycerides     Status: Abnormal   Collection Time: 12/03/18  7:20 PM  Result Value Ref Range   Triglycerides 239 (H) <150 mg/dL    Comment: Performed at [facility], [address] [place], [place] [address]  C-reactive protein     Status: Abnormal   Collection Time: 12/03/18  7:20 PM  Result Value Ref Range   CRP 4.9 (H) <1.0 mg/dL    Comment: Performed at [facility], [address] [place], [place] [address]  Comprehensive metabolic panel     Status: Abnormal   Collection Time: 12/03/18  7:20 PM  Result Value Ref Range   Sodium 131 (L) 135 - 145 mmol/L   Potassium 4.3 3.5 - 5.1 mmol/L   Chloride 94 (L) 98 - 111 mmol/L   CO2 25 22 - 32 mmol/L   Glucose, Bld 386 (H) 70 - 99 mg/dL   BUN 38 (H) 6 - 20 mg/dL   Creatinine, Ser 1.91 (H) 0.61 - 1.24 mg/dL   Calcium 8.4 (L) 8.9 - 10.3 mg/dL   Total Protein 7.0 6.5 - 8.1 g/dL   Albumin 3.2 (L) 3.5 - 5.0 g/dL   AST 70 (H) 15 - 41 U/L   ALT 46 (H) 0 - 44 U/L   Alkaline Phosphatase 34 (L) 38 - 126 U/L   Total Bilirubin 1.4 (H) 0.3 - 1.2 mg/dL   GFR calc non Af Amer 22 (L) >60 mL/min   GFR calc Af Amer 26 (L) >60 mL/min   Anion gap 12 5 - 15    Comment: Performed at [facility], [address], [place], [place] [address]  Lactate dehydrogenase     Status: Abnormal   Collection Time: 12/03/18  7:20 PM  Result Value Ref Range   LDH 543 (H) 98 - 192 U/L    Comment: Performed at [facility], [address] [place], [place] [address]  Procalcitonin     Status: None   Collection Time: 12/03/18  7:20 PM  Result Value Ref Range   Procalcitonin 0.13 ng/mL    Comment:        Interpretation: PCT (Procalcitonin) <= 0.5 ng/mL: Systemic infection (sepsis) is not likely. Local bacterial infection is possible. (NOTE)       Sepsis PCT Algorithm           Lower Respiratory Tract                                      Infection PCT Algorithm    ----------------------------     ----------------------------         PCT < 0.25 ng/mL                PCT < 0.10 ng/mL         Strongly encourage             Strongly discourage   discontinuation of antibiotics    initiation of antibiotics    ----------------------------     -----------------------------       PCT 0.25 - 0.50 ng/mL            PCT 0.10 - 0.25 ng/mL               OR       >80% decrease in PCT            Discourage initiation of                                            antibiotics      Encourage discontinuation           of antibiotics    ----------------------------     -----------------------------         PCT >=  0.50 ng/mL              PCT 0.26 - 0.50 ng/mL               AND        <80% decrease in PCT             Encourage initiation of                                             antibiotics       Encourage continuation           of antibiotics    ----------------------------     -----------------------------        PCT >= 0.50 ng/mL                  PCT > 0.50 ng/mL               AND         increase in PCT                  Strongly encourage                                      initiation of antibiotics    Strongly encourage escalation           of antibiotics                                      -----------------------------                                           PCT <= 0.25 ng/mL                                                 OR                                        > 80% decrease in PCT                                     Discontinue / Do not initiate                                             antibiotics Performed at [facility], [address], [place], [place] [address]   Lactic acid, plasma     Status: None   Collection Time: 12/03/18  7:28 PM  Result Value Ref Range   Lactic Acid, Venous 1.4 0.5 - 1.9 mmol/L    Comment: Performed at [facility] [address], [place], [place] [address]  Blood Culture (  routine x 2)     Status: None (Preliminary result)   Collection Time: 12/03/18  7:28 PM   Specimen: BLOOD  Result Value Ref Range   Specimen Description BLOOD LEFT ANTECUBITAL    Special Requests      BOTTLES DRAWN AEROBIC AND ANAEROBIC Blood Culture adequate volume   Culture      NO GROWTH < 24 HOURS Performed at [facility], [address] [place], [place] [address]    Report Status PENDING   CBC with Differential     Status: Abnormal   Collection Time: 12/03/18  7:32 PM  Result Value Ref Range   WBC 7.3 4.0 - 10.5 K/uL   RBC 5.01 4.22 - 5.81 MIL/uL   Hemoglobin 15.2 13.0 - 17.0 g/dL   HCT 60.4 54.0 - 98.1 %   MCV 89.8 80.0 - 100.0 fL   MCH 30.3 26.0 - 34.0 pg   MCHC 33.8 30.0 - 36.0 g/dL   RDW 19.1 (L) 47.8 - 29.5 %   Platelets 195 150 - 400 K/uL   nRBC 0.0 0.0 - 0.2 %   Neutrophils Relative % 63 %   Neutro Abs 4.5 1.7 - 7.7 K/uL   Lymphocytes Relative 28 %   Lymphs Abs 2.1 0.7 - 4.0 K/uL   Monocytes Relative 9 %   Monocytes Absolute 0.7 0.1 - 1.0 K/uL   Eosinophils Relative 0 %   Eosinophils Absolute 0.0 0.0 - 0.5 K/uL   Basophils Relative 0 %   Basophils Absolute 0.0 0.0 - 0.1 K/uL   Immature Granulocytes 0 %   Abs Immature Granulocytes 0.03 0.00 - 0.07 K/uL    Comment: Performed at [facility], [address] [place], [place] [address]  Urinalysis, Routine w reflex microscopic     Status: Abnormal   Collection Time: 12/03/18  8:20 PM  Result Value Ref Range   Color, Urine AMBER (A) YELLOW    Comment: BIOCHEMICALS MAY BE AFFECTED BY COLOR   APPearance HAZY (A) CLEAR   Specific Gravity, Urine 1.019 1.005 - 1.030   pH 5.0 5.0 - 8.0   Glucose, UA 50 (A) NEGATIVE mg/dL   Hgb urine dipstick NEGATIVE NEGATIVE   Bilirubin Urine NEGATIVE NEGATIVE   Ketones, ur NEGATIVE NEGATIVE mg/dL   Protein, ur 865 (A) NEGATIVE mg/dL   Nitrite NEGATIVE NEGATIVE   Leukocytes,Ua NEGATIVE NEGATIVE   RBC / HPF 0-5 0 - 5 RBC/hpf   WBC, UA 6-10 0 - 5 WBC/hpf   Bacteria, UA RARE (A) NONE SEEN   Squamous Epithelial / LPF 0-5 0 - 5   Hyaline Casts, UA PRESENT    Granular Casts, UA PRESENT    Amorphous Crystal PRESENT     Comment: Performed at [facility], [address], [place], [place] [address]  Sodium, urine, random     Status: None   Collection Time: 12/03/18  8:20 PM  Result Value Ref Range   Sodium, Ur <10 mmol/L    Comment: Performed at [facility], [address] [place], [place] [address]  Creatinine, urine, random     Status: None   Collection Time: 12/03/18  8:20 PM  Result Value Ref Range   Creatinine, Urine 639.01 mg/dL    Comment: RESULTS CONFIRMED BY MANUAL DILUTION Performed at [facility], [address], [place], [place] [address]   Hemoglobin A1c     Status: Abnormal   Collection Time: 12/03/18  9:25 PM  Result Value Ref Range   Hgb A1c MFr Bld 14.0 (H) 4.8 - 5.6 %    Comment: (NOTE) Pre diabetes:          5.7%-6.4% Diabetes:              >6.4% Glycemic control for   <7.0% adults with diabetes    Mean Plasma Glucose 355.1 mg/dL    Comment: Performed at [facility], [address] [place], [place] [address]  CBG monitoring, ED     Status: Abnormal   Collection Time: 12/03/18 10:29 PM  Result Value Ref Range   Glucose-Capillary 380 (H) 70 - 99 mg/dL    Comment 1 Notify RN    Comment 2 Document in Chart   Procalcitonin     Status: None   Collection Time: 12/04/18  2:29 AM  Result Value Ref Range   Procalcitonin <0.10 ng/mL    Comment:        Interpretation: PCT (Procalcitonin) <= 0.5 ng/mL: Systemic infection (sepsis) is not likely. Local bacterial infection is possible. (NOTE)       Sepsis PCT Algorithm           Lower Respiratory Tract                                      Infection PCT Algorithm    ----------------------------     ----------------------------         PCT < 0.25 ng/mL                PCT < 0.10 ng/mL         Strongly encourage             Strongly discourage   discontinuation of antibiotics    initiation of antibiotics    ----------------------------     -----------------------------       PCT 0.25 - 0.50 ng/mL            PCT 0.10 - 0.25 ng/mL               OR       >80% decrease in PCT            Discourage initiation of                                            antibiotics      Encourage discontinuation           of antibiotics    ----------------------------     -----------------------------         PCT >= 0.50 ng/mL              PCT 0.26 - 0.50 ng/mL               AND        <80% decrease in PCT             Encourage initiation of                                             antibiotics       Encourage continuation  of antibiotics    ----------------------------     -----------------------------        PCT >= 0.50 ng/mL                  PCT > 0.50 ng/mL               AND         increase in PCT                  Strongly encourage                                      initiation of antibiotics    Strongly encourage escalation           of antibiotics                                     -----------------------------                                           PCT <= 0.25 ng/mL                                                 OR                                        > 80% decrease in PCT                                      Discontinue / Do not initiate                                             antibiotics Performed at [facility], [address], [place], [place] [address]   D-dimer, quantitative (not at [facility])     Status: Abnormal   Collection Time: 12/04/18  2:29 AM  Result Value Ref Range   D-Dimer, Quant 0.88 (H) 0.00 - 0.50 ug/mL-FEU    Comment: (NOTE) At the manufacturer cut-off of 0.50 ug/mL FEU, this assay has been documented to exclude PE with a sensitivity and negative predictive value of 97 to 99%.  At this time, this assay has not been approved by the FDA to exclude DVT/VTE. Results should be correlated with clinical presentation. Performed at [facility], [address] [place], [place] [address]   C-reactive protein     Status: Abnormal   Collection Time: 12/04/18  2:29 AM  Result Value Ref Range   CRP 5.2 (H) <1.0 mg/dL    Comment: Performed at [facility], [address], [place], [place] [address]  Hepatitis B surface antigen     Status: None   Collection Time: 12/04/18  2:30 AM  Result Value Ref Range   Hepatitis  B Surface Ag NON REACTIVE NON REACTIVE    Comment: Performed at [facility], [address] [place], [place] [address]  HIV Antibody (routine testing w rflx)     Status: None   Collection Time: 12/04/18  2:31 AM  Result Value Ref Range   HIV Screen 4th Generation wRfx NON REACTIVE NON REACTIVE    Comment: Performed at [facility], [address] [place], [place] [address]  ABO/Rh     Status: None   Collection Time: 12/04/18  2:31 AM  Result Value Ref Range   ABO/RH(D)      O POS Performed at [facility], [address] [place], [place] [address]   CBC with Differential/Platelet     Status: Abnormal   Collection Time: 12/04/18  2:31 AM  Result Value Ref Range   WBC 7.0 4.0 - 10.5 K/uL   RBC 4.98 4.22 - 5.81 MIL/uL   Hemoglobin 15.1 13.0 - 17.0 g/dL   HCT 96.2 95.2 - 84.1 %   MCV 89.6 80.0  - 100.0 fL   MCH 30.3 26.0 - 34.0 pg   MCHC 33.9 30.0 - 36.0 g/dL   RDW 32.4 (L) 40.1 - 02.7 %   Platelets 181 150 - 400 K/uL   nRBC 0.0 0.0 - 0.2 %   Neutrophils Relative % 76 %   Neutro Abs 5.3 1.7 - 7.7 K/uL   Lymphocytes Relative 17 %   Lymphs Abs 1.2 0.7 - 4.0 K/uL   Monocytes Relative 7 %   Monocytes Absolute 0.5 0.1 - 1.0 K/uL   Eosinophils Relative 0 %   Eosinophils Absolute 0.0 0.0 - 0.5 K/uL   Basophils Relative 0 %   Basophils Absolute 0.0 0.0 - 0.1 K/uL   Immature Granulocytes 0 %   Abs Immature Granulocytes 0.03 0.00 - 0.07 K/uL    Comment: Performed at [facility], [address] [place], [place] [address]  Comprehensive metabolic panel     Status: Abnormal   Collection Time: 12/04/18  2:31 AM  Result Value Ref Range   Sodium 134 (L) 135 - 145 mmol/L   Potassium 4.3 3.5 - 5.1 mmol/L   Chloride 98 98 - 111 mmol/L   CO2 23 22 - 32 mmol/L   Glucose, Bld 429 (H) 70 - 99 mg/dL   BUN 41 (H) 6 - 20 mg/dL   Creatinine, Ser 4.40 (H) 0.61 - 1.24 mg/dL   Calcium 8.3 (L) 8.9 - 10.3 mg/dL   Total Protein 7.2 6.5 - 8.1 g/dL   Albumin 3.2 (L) 3.5 - 5.0 g/dL   AST 54 (H) 15 - 41 U/L   ALT 44 0 - 44 U/L   Alkaline Phosphatase 34 (L) 38 - 126 U/L   Total Bilirubin 1.0 0.3 - 1.2 mg/dL   GFR calc non Af Amer 25 (L) >60 mL/min   GFR calc Af Amer 28 (L) >60 mL/min   Anion gap 13 5 - 15    Comment: Performed at [facility], [address] [place], [place] [address]  Ferritin     Status: Abnormal   Collection Time: 12/04/18  2:31 AM  Result Value Ref Range   Ferritin >7,500 (H) 24 - 336 ng/mL    Comment: Performed at [facility], [address], [place], [place] [address]  Magnesium     Status: None   Collection Time: 12/04/18  2:31 AM  Result Value Ref Range  Magnesium 2.2 1.7 - 2.4 mg/dL    Comment: Performed at [facility], [address] [place], [place] [address]  Phosphorus     Status: None   Collection Time: 12/04/18  2:31 AM  Result  Value Ref Range   Phosphorus 3.9 2.5 - 4.6 mg/dL    Comment: Performed at [facility], [address], [place], [place] [address]  CBG monitoring, ED     Status: Abnormal   Collection Time: 12/04/18  6:13 AM  Result Value Ref Range   Glucose-Capillary 511 (HH) 70 - 99 mg/dL   Comment 1 Document in Chart   CBG monitoring, ED     Status: Abnormal   Collection Time: 12/04/18  7:12 AM  Result Value Ref Range   Glucose-Capillary 513 (HH) 70 - 99 mg/dL   Comment 1 Notify RN    Comment 2 Document in Chart   CBG monitoring, ED     Status: Abnormal   Collection Time: 12/04/18 11:32 AM  Result Value Ref Range   Glucose-Capillary 552 (HH) 70 - 99 mg/dL   Comment 1 Notify RN   CBG monitoring, ED     Status: Abnormal   Collection Time: 12/04/18 12:24 PM  Result Value Ref Range   Glucose-Capillary 448 (H) 70 - 99 mg/dL   Comment 1 Notify RN    Comment 2 Document in Chart   CBG monitoring, ED     Status: Abnormal   Collection Time: 12/04/18  1:29 PM  Result Value Ref Range   Glucose-Capillary 431 (H) 70 - 99 mg/dL   Comment 1 Notify RN    Comment 2 Document in Chart    US Renal  Result Date: 12/03/2018 CLINICAL DATA:  Renal failure EXAM: RENAL / URINARY TRACT ULTRASOUND COMPLETE COMPARISON:  None. FINDINGS: Right Kidney: Renal measurements: 10.8 x 5.4 x 5.7 cm = volume: 173 mL . Echogenicity within normal limits. No mass or hydronephrosis visualized. Left Kidney: Renal measurements: 10.2 x 5.3 x 6.0 cm = volume: 167 mL. Echogenicity within normal limits. No mass or hydronephrosis visualized. Bladder: Wall is thickened.  Wall measures 11 mm in thickness. Other: None. IMPRESSION: Thickened urinary bladder wall which could reflect cystitis or a component of bladder outlet obstruction. No hydronephrosis. Electronically Signed   By: Charlett Nose M.D.   On: 12/03/2018 22:40   Dg Chest Portable 1 View  Result Date: 12/03/2018 CLINICAL DATA:  Chest pain with 4 days of cough, body aches,  fatigue and abdominal pain EXAM: PORTABLE CHEST 1 VIEW COMPARISON:  None FINDINGS: Multifocal areas of patchy airspace consolidation most pronounced in the left lung periphery. No pneumothorax or effusion. The cardiomediastinal contours are unremarkable. No acute osseous or soft tissue abnormality. Degenerative changes are present in the imaged spine and shoulders. IMPRESSION: Findings concerning for a multifocal pneumonia in the appropriate clinical setting. Electronically Signed   By: Kreg Shropshire M.D.   On: 12/03/2018 18:42    Pending Labs Unresulted Labs (From admission, onward)    Start     Ordered   12/05/18 0500  Basic metabolic panel  (Hyperglycemia (not DKA or HHS))  Daily,   STAT     12/04/18 1219   12/04/18 1600  Basic metabolic panel  Once,   STAT     12/04/18 1227   12/04/18 0500  CBC with Differential/Platelet  Daily,   R     12/04/18 0158   12/04/18 0500  Comprehensive metabolic panel  Daily,  R     12/04/18 0158   12/04/18 0500  C-reactive protein  Daily,   R     12/04/18 0158   12/04/18 0500  D-dimer, quantitative (not at [facility])  Daily,   R     12/04/18 0158   12/04/18 0500  Ferritin  Daily,   R     12/04/18 0158   12/04/18 0500  Magnesium  Daily,   R     12/04/18 0158   12/04/18 0500  Phosphorus  Daily,   R     12/04/18 0158          Vitals/Pain Today's Vitals   12/04/18 0734 12/04/18 1030 12/04/18 1226 12/04/18 1329  BP:  132/87 (!) 127/98   Pulse:  (!) 103 85   Resp:  20 18   Temp:      TempSrc:      SpO2:  96% 96%   Weight: 114.8 kg     Height:     (1.702 m)  PainSc:        Isolation Precautions Airborne and Contact precautions  Medications Medications  acetaminophen (TYLENOL) tablet 650 mg (has no administration in time range)  HYDROcodone-acetaminophen (NORCO/VICODIN) 5-325 MG per tablet 1-2 tablet (has no administration in time range)  ondansetron (ZOFRAN) injection 4 mg (has no administration in time range)  guaiFENesin (ROBITUSSIN) 100  MG/5ML solution 100 mg (has no administration in time range)  dexamethasone (DECADRON) injection 6 mg (6 mg Intravenous Given 12/03/18 2236)  heparin injection 5,000 Units (5,000 Units Subcutaneous Given 12/04/18 0652)  remdesivir 200 mg in sodium chloride 0.9 % 250 mL IVPB (0 mg Intravenous Stopped 12/04/18 0021)    Followed by  remdesivir 100 mg in sodium chloride 0.9 % 250 mL IVPB (has no administration in time range)  insulin glargine (LANTUS) injection 10 Units (10 Units Subcutaneous Given 12/04/18 0802)  lactated ringers infusion ( Intravenous Hold 12/04/18 1358)  insulin regular, human (MYXREDLIN) 100 units/ 100 mL infusion (14 Units/hr Intravenous New Bag/Given 12/04/18 1356)  0.9 %  sodium chloride infusion ( Intravenous New Bag/Given 12/04/18 1358)  dextrose 5 %-0.45 % sodium chloride infusion (has no administration in time range)  dextrose 50 % solution 0-50 mL (has no administration in time range)  ondansetron (ZOFRAN) injection 4 mg (4 mg Intravenous Given 12/03/18 1903)  acetaminophen (TYLENOL) tablet 1,000 mg (1,000 mg Oral Given 12/03/18 1903)  sodium chloride 0.9 % bolus 500 mL (0 mLs Intravenous Stopped 12/03/18 2033)  insulin aspart (novoLOG) injection 10 Units (10 Units Subcutaneous Given 12/04/18 0651)    Mobility walks Low fall risk   Focused Assessments Pulmonary Assessment Handoff:  Lung sounds: L Breath Sounds: Diminished R Breath Sounds: Diminished O2 Device: Nasal Cannula O2 Flow Rate (L/min): 2 L/min      R Recommendations: See Admitting Provider Note  Report given to:   Additional Notes:

## 2018-12-04 NOTE — ED Notes (Signed)
GV MS   Breakfast ordered

## 2018-12-04 NOTE — Progress Notes (Signed)
Caleb Carrillo  ZOX:[id number] DOB: 03/19/1975 DOA: 12/03/2018 PCP: System, Pcp Not In    Brief Narrative:  43 year old with a history of DM2 and HTN who presented to the ED with complaints of cough, body aches, anorexia, and diarrhea for approximately 5 days.  He was initially evaluated in urgent care center where he was found to be Covid positive, tachycardic, and hypoxic and was therefore directed to [facility] ED for further evaluation.  In the ED he was found to be saturating 90% on room air and tachycardic and tachypneic.  A CXR noted diffuse pulmonary infiltrates.  Significant Events: 11/18 admit to [facility]  COVID-19 specific Treatment: Remdesivir 11/18 > Decadron 11/18 >  Subjective: Oxygen saturations presently appears stable in the 90-96% range on 3 L nasal cannula support.  Blood pressure is modestly elevated.  Creatinine is improving.  CBG not yet at goal but improving overall.  I have personally reviewed the patient's follow-up chest x-ray this morning which reveals worsening dense infiltrate in the left lung with slightly less prominent infiltrate in the right base.  Patient is resting comfortably.  He has no new complaints.  He is anxious to be discharged home as soon as possible.  Assessment & Plan:  Covid pneumonia Continue remdesivir and Decadron -appears to be clinically stabilizing therefore will not consider other therapeutics at this time -if declines will discuss convalescent plasma plus/minus Actemra with the patient  Recent Labs  Lab 12/02/18 1830 12/03/18 1920 12/04/18 0229 12/04/18 0231 12/05/18 0004  DDIMER  --   --  0.88*  --  0.64*  FERRITIN  --  >7,500*  --  >7,500* 5,614*  CRP  --  4.9* 5.2*  --  3.3*  ALT 49* 46*  --  44 41  PROCALCITON  --  0.13 <0.10  --   --     Acute kidney injury Creatinine 3.25 on admission -baseline 1.5 in 2019 -felt to likely represent prerenal azotemia complicated by the use of an  ACE inhibitor -no hydronephrosis on renal ultrasound -monitor trend  Recent Labs  Lab 12/02/18 1830 12/03/18 1920 12/04/18 0231 12/04/18 1535 12/05/18 0004  CREATININE 2.94* 3.25* 3.01* 2.69* 2.21*     DM 2 -severely uncontrolled with hyperglycemia A1c 14 -successfully transitioned off insulin drip -adjust insulin further and follow CBG  HTN Adjust treatment and follow -resume his usual ACE and diuretics when renal function more stable -for now supplement with BiDil  Transaminitis Likely due to coronavirus itself -follow with ongoing use of remdesivir  ?Bladder thickening on renal ultrasound No current symptoms to suggest UTI  Obesity - Body mass index is 39.63 kg/m.   DVT prophylaxis: Subcutaneous heparin Code Status: FULL CODE Family Communication:  Disposition Plan: Med/Surg   Consultants:  none  Antimicrobials:  None  Objective: Blood pressure (!) 151/70, pulse 77, temperature 98.1 F (36.7 C), temperature source Oral, resp. rate (!) 22, height 5\' 7"  (1.702 m), weight 114.8 kg, SpO2 96 %.  Intake/Output Summary (Last 24 hours) at 12/05/2018 0856 Last data filed at 12/05/2018 0524 Gross per 24 hour  Intake 877.14 ml  Output 650 ml  Net 227.14 ml   Filed Weights   12/04/18 0734  Weight: 114.8 kg    Examination: General: No acute respiratory distress Lungs: Clear to auscultation bilaterally without wheezes or crackles Cardiovascular: Regular rate and rhythm without murmur gallop or rub normal S1 and S2 Abdomen: Nontender, nondistended, soft, bowel sounds positive,  no rebound, no ascites, no appreciable mass Extremities: No significant cyanosis, clubbing, or edema bilateral lower extremities   CBC: Recent Labs  Lab 12/03/18 1932 12/04/18 0231 12/05/18 0004  WBC 7.3 7.0 7.7  NEUTROABS 4.5 5.3 5.6  HGB 15.2 15.1 14.0  HCT 45.0 44.6 42.0  MCV 89.8 89.6 89.6  PLT 195 181 295   Basic Metabolic Panel: Recent Labs  Lab 12/04/18 0231 12/04/18  1535 12/05/18 0004  NA 134* 136 138  K 4.3 3.7 3.7  CL 98 100 102  CO2 23 23 22   GLUCOSE 429* 233* 171*  BUN 41* 47* 53*  CREATININE 3.01* 2.69* 2.21*  CALCIUM 8.3* 8.5* 8.5*  MG 2.2  --  2.4  PHOS 3.9  --   --    GFR: Estimated Creatinine Clearance: 53.3 mL/min (A) (by C-G formula based on SCr of 2.21 mg/dL (H)).  Liver Function Tests: Recent Labs  Lab 12/02/18 1830 12/03/18 1920 12/04/18 0231 12/05/18 0004  AST 56* 70* 54* 38  ALT 49* 46* 44 41  ALKPHOS 34* 34* 34* 33*  BILITOT 0.8 1.4* 1.0 0.9  PROT 7.0 7.0 7.2 7.1  ALBUMIN 3.4* 3.2* 3.2* 3.0*    HbA1C: Hgb A1c MFr Bld  Date/Time Value Ref Range Status  12/03/2018 09:25 PM 14.0 (H) 4.8 - 5.6 % Final    Comment:    (NOTE) Pre diabetes:          5.7%-6.4% Diabetes:              >6.4% Glycemic control for   <7.0% adults with diabetes     CBG: Recent Labs  Lab 12/04/18 2127 12/04/18 2229 12/04/18 2329 12/05/18 0031 12/05/18 0802  GLUCAP 220* 190* 188* 152* 283*    Recent Results (from the past 240 hour(s))  Blood Culture (routine x 2)     Status: None (Preliminary result)   Collection Time: 12/03/18  7:15 PM   Specimen: BLOOD LEFT HAND  Result Value Ref Range Status   Specimen Description BLOOD LEFT HAND  Final   Special Requests   Final    BOTTLES DRAWN AEROBIC AND ANAEROBIC Blood Culture adequate volume   Culture   Final    NO GROWTH < 24 HOURS Performed at [facility], [place] [address] [place], [place] [address]    Report Status PENDING  Incomplete  Blood Culture (routine x 2)     Status: None (Preliminary result)   Collection Time: 12/03/18  7:28 PM   Specimen: BLOOD  Result Value Ref Range Status   Specimen Description BLOOD LEFT ANTECUBITAL  Final   Special Requests   Final    BOTTLES DRAWN AEROBIC AND ANAEROBIC Blood Culture adequate volume   Culture   Final    NO GROWTH < 24 HOURS Performed at [facility], [place] [address] [place], [place] [address]    Report Status  PENDING  Incomplete     Scheduled Meds: . dexamethasone (DECADRON) injection  6 mg Intravenous Q24H  . heparin  5,000 Units Subcutaneous Q8H  . insulin aspart  0-20 Units Subcutaneous TID WC  . insulin aspart  0-5 Units Subcutaneous QHS  . insulin glargine  16 Units Subcutaneous BID   Continuous Infusions: . sodium chloride 75 mL/hr at 12/04/18 1842  . remdesivir 100 mg in NS 250 mL       LOS: 2 days   Cherene Altes, MD [facility] Office  [phone] Pager - Text Page per Shea Evans  If 7PM-7AM, please contact night-coverage per Amion 12/05/2018, 8:56 AM

## 2018-12-04 NOTE — ED Notes (Signed)
Doctor at bedside.

## 2018-12-04 NOTE — ED Notes (Signed)
Report given to [facility] at [facility]

## 2018-12-04 NOTE — ED Notes (Signed)
Patient requested a shower spoke with charge nurse unable to take shower due to covid positive. Patient given wash basin, towels, soap, toothpaste. Nurse tech will assist.

## 2018-12-04 NOTE — ED Notes (Signed)
[facility] called for transport to [facility] 

## 2018-12-04 NOTE — Progress Notes (Signed)
**Note De-Identified vi Obfusction** PROGRESS NOTE    Caleb Carrillo  YQI:[id number] DOB: May 08, 1975 DOA: 12/03/2018 PCP: System, Pcp Not In   Brief Nrrtive:  Caleb Carrillo is  43 y.o. mle with medicl history significnt for type 2 dibetes mellitus nd hypertension, presenting to the emergency deprtment for evlution of cough, ches, loss of ppetite, nd loose stools.  Ptient reports tht he been in his usul stte of helth until pproximtely 5 dys go when he developed the forementioned symptoms.  He ws not wre of ny sick contcts.  He hs been experiencing subjective fevers.  He is not eting or drinking much of nything over the pst 3 dys due to loss of ppetite.  He denies bdominl pin.  Denies vomiting.  Denies chest pin.  Reports continued dherence with his medictions including lisinopril nd HCTZ.  He ws evluted for these complints t n urgent cre, found to be positive for COVID-19, tchycrdic, hypoxic, nd ws directed to the ED for further evlution.  ED Course: Upon rrivl to the ED, ptient is found to be febrile to 38.2 C, sturting 90% on room ir while t rest, mildly tchypneic, tchycrdic in the 110s, nd with stble blood pressure.  EKG fetures sinus tchycrdi nd chest x-ry is concerning for multifocl pneumoni.  Chemistry pnel is notble for glucose 386, mild elevtion in trnsminses, nd cretinine 3.25, up from 2.94 yesterdy nd 1.5  yer go.  CBC is unremrkble nd lctic cid ressuringly norml.  Blood cultures were collected in the emergency deprtment nd the ptient ws treted with 500 cc norml sline, Zofrn, nd cetminophen.  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 COVID-19 pneumoni  - Presents with 5 dys of mlise, f/c, cough, norexi, nd loose stools, nd is found to be febrile with tchycrdi nd tchypne, CXR  concerning for multifocl PNA, nd positive for COVID-19  - Currently comfortble on 2 L stting in high 90's - Decdron, remdesivir - proclcitonin not suggestive of infection - Dily inflmmtory lbs - significntly elevted ferritin, CRP 5.2, d dimer 0.88 - I/O, dily weights - Positive POC SARS Coronvirus 2 Ag on 11/18  COVID-19 Lbs  Recent Lbs    12/03/18 1920 12/04/18 0229 12/04/18 0231  DDIMER  --  0.88*  --   FERRITIN >7,500*  --  >7,500*  LDH 543*  --   --   CRP 4.9* 5.2*  --     No results found for: SARSCOV2NAA  2. Acute kidney injury  - SCr is 3.25 on dmission, up from 2.94 the dy before nd 1.5 in 2019 per cre everywhere - Likely cute prerenl zotemi in setting of norexi, loose stool, low BP, nd continued diuretic use, s well s concomitnt ACE inhibition  - urine sodium <10, UA with 100 mg/dl protein - There is no cidosis or hyperklemi  - Hold HCTZ nd lisinopril - Renl Kore without hydro - Continue IVF, control BG's to limit osmotic diuresis - Continue to follow BMP  3. Type II DM  - A1c ws 7.6% in My 2019 -> now this is 39 - Mnged with metformin t home, held on dmission  - Switched to insulin gtt due to worsening hyperglycemi despite SSI - Monitor for need to trnsition bck to subcutneous insulin - Received lntus 10 units 11/19 AM  4. Hypertension  - SBP low 90's initilly, improved with IVF -  Hold lisinopril and HCTZ in light of KI  - improved, follow  5. Elevated transaminases  - ST is 70 and LT 46 on admission  - bdominal exam is benign and this is likely secondary to COVID-19, will trend    Concern for bladder thickening on renal US: U not clearly indicative of UTI - follow culture  DVT prophylaxis: heparin Code Status: full  Family Communication: none at bedside Disposition Plan: pending further improvement  Consultants:   none  Procedures:   none  ntimicrobials:  nti-infectives (From admission,  onward)   Start     Dose/Rate Route Frequency Ordered Stop   12/04/18 2122  remdesivir 100 mg in sodium chloride 0.9 % 250 mL IVPB     100 mg 500 mL/hr over 30 Minutes Intravenous Every 24 hours 12/03/18 2122 12/08/18 2129   12/03/18 2130  remdesivir 200 mg in sodium chloride 0.9 % 250 mL IVPB     200 mg 500 mL/hr over 30 Minutes Intravenous Once 12/03/18 2122 12/04/18 0021     Subjective: C/o poor PO intake, body aches  Objective: Vitals:   12/04/18 1300 12/04/18 1329 12/04/18 1500 12/04/18 1515  BP: 112/71  112/83 112/83  Pulse: 87  86 (!) 101  Resp:    18  Temp:      TempSrc:      SpO2: 98%  98% 96%  Weight:      Height:  5\' 7"  (1.702 m)      Intake/Output Summary (Last 24 hours) at 12/04/2018 1521 Last data filed at 12/04/2018 0656 Gross per 24 hour  Intake 500 ml  Output 400 ml  Net 100 ml   Filed Weights   12/04/18 0734  Weight: 114.8 kg    Examination:  General exam: ppears calm and comfortable  Respiratory system: unlabored Cardiovascular system: RRR Gastrointestinal system: bdomen is nondistended, soft and nontender.  Central nervous system: lert and oriented. No focal neurological deficits. Extremities: no LEE Skin: No rashes, lesions or ulcers Psychiatry: Judgement and insight appear normal. Mood & affect appropriate.     Data Reviewed: I have personally reviewed following labs and imaging studies  CBC: Recent Labs  Lab 12/02/18 1830 12/03/18 1932 12/04/18 0231  WBC 6.6 7.3 7.0  NEUTROBS 3.3 4.5 5.3  HGB 15.5 15.2 15.1  HCT 45.6 45.0 44.6  MCV 88.4 89.8 89.6  PLT 163 195 242   Basic Metabolic Panel: Recent Labs  Lab 12/02/18 1830 12/03/18 1920 12/04/18 0231  N 133* 131* 134*  K 3.7 4.3 4.3  CL 97* 94* 98  CO2 22 25 23   GLUCOSE 422* 386* 429*  BUN 27* 38* 41*  CRETININE 2.94* 3.25* 3.01*  CLCIUM 8.7* 8.4* 8.3*  MG  --   --  2.2  PHOS  --   --  3.9   GFR: Estimated Creatinine Clearance: 39.1 mL/min () (by C-G formula  based on SCr of 3.01 mg/dL (H)). Liver Function Tests: Recent Labs  Lab 12/02/18 1830 12/03/18 1920 12/04/18 0231  ST 56* 70* 54*  LT 49* 46* 44  LKPHOS 34* 34* 34*  BILITOT 0.8 1.4* 1.0  PROT 7.0 7.0 7.2  LBUMIN 3.4* 3.2* 3.2*   No results for input(s): LIPSE, MYLSE in the last 168 hours. No results for input(s): MMONI in the last 168 hours. Coagulation Profile: No results for input(s): INR, PROTIME in the last 168 hours. Cardiac Enzymes: No results for input(s): CKTOTL, CKMB, CKMBINDEX, TROPONINI in the last 168 hours. BNP (last 3 **Note De-Identified vi Obfusction** results) No results for input(s): PROBNP in the lst 8760 hours. HbA1C: Recent Lbs    12/03/18 2125  HGBA1C 14.0*   CBG: Recent Lbs  Lb 12/04/18 1132 12/04/18 1224 12/04/18 1329 12/04/18 1420 12/04/18 1517  GLUCAP 552* 448* 431* 413* 288*   Lipid Profile: Recent Lbs    12/03/18 1920  TRIG 239*   Thyroid Function Tests: No results for input(s): TSH, T4TOTAL, FREET4, T3FREE, THYROIDAB in the lst 72 hours. Anemi Pnel: Recent Lbs    12/03/18 1920 12/04/18 0231  FERRITIN >7,500* >7,500*   Sepsis Lbs: Recent Lbs  Lb 12/03/18 1920 12/03/18 1928 12/04/18 0229  PROCALCITON 0.13  --  <0.10  LATICACIDVEN  --  1.4  --     Recent Results (from the pst 240 hour(s))  Blood Culture (routine x 2)     Sttus: None (Preliminry result)   Collection Time: 12/03/18  7:15 PM   Specimen: BLOOD LEFT HAND  Result Vlue Ref Rnge Sttus   Specimen Description BLOOD LEFT HAND  Finl   Specil Requests   Finl    BOTTLES DRAWN AEROBIC AND ANAEROBIC Blood Culture dequte volume   Culture   Finl    NO GROWTH < 24 HOURS Performed t [facility], [address], [place], [place] [address]    Report Sttus PENDING  Incomplete  Blood Culture (routine x 2)     Sttus: None (Preliminry result)   Collection Time: 12/03/18  7:28 PM   Specimen: BLOOD  Result Vlue Ref Rnge Sttus   Specimen Description BLOOD LEFT  ANTECUBITAL  Finl   Specil Requests   Finl    BOTTLES DRAWN AEROBIC AND ANAEROBIC Blood Culture dequte volume   Culture   Finl    NO GROWTH < 24 HOURS Performed t [facility], [address] [place], [place] [address]    Report Sttus PENDING  Incomplete         Rdiology Studies: Kore Renl  Result Dte: 12/03/2018 CLINICAL DATA:  Renl filure EXAM: RENAL / URINARY TRACT ULTRASOUND COMPLETE COMPARISON:  None. FINDINGS: Right Kidney: Renl mesurements: 10.8 x 5.4 x 5.7 cm = volume: 173 mL . Echogenicity within norml limits. No mss or hydronephrosis visulized. Left Kidney: Renl mesurements: 10.2 x 5.3 x 6.0 cm = volume: 167 mL. Echogenicity within norml limits. No mss or hydronephrosis visulized. Bldder: Wll is thickened.  Wll mesures 11 mm in thickness. Other: None. IMPRESSION: Thickened urinry bldder wll which could reflect cystitis or  component of bldder outlet obstruction. No hydronephrosis. Electroniclly Signed   By: Chrlett Nose M.D.   On: 12/03/2018 22:40   Dg Chest Portble 1 View  Result Dte: 12/03/2018 CLINICAL DATA:  Chest pin with 4 dys of cough, body ches, ftigue nd bdominl pin EXAM: PORTABLE CHEST 1 VIEW COMPARISON:  None FINDINGS: Multifocl res of ptchy irspce consolidtion most pronounced in the left lung periphery. No pneumothorx or effusion. The crdiomedistinl contours re unremrkble. No cute osseous or soft tissue bnormlity. Degenertive chnges re present in the imged spine nd shoulders. IMPRESSION: Findings concerning for  multifocl pneumoni in the pproprite clinicl setting. Electroniclly Signed   By: Kreg Shropshire M.D.   On: 12/03/2018 18:42    Scheduled Meds: . dexmethsone (DECADRON) injection  6 mg Intrvenous Q24H  . heprin  5,000 Units Subcutneous Q8H  . insulin glrgine  10 Units Subcutneous Dily   Continuous Infusions: . sodium chloride 75 mL/hr t 12/04/18 1358  . dextrose 5 %  and  0.45% NaCl    . insulin 11.5 Units/hr (12/04/18 1520)  . lactated ringers Stopped (12/04/18 1358)  . remdesivir 100 mg in NS 250 mL       LOS: 1 day    Time spent: over 30 min    Lacretia Nicksaldwell Powell, MD [facility] Pager AMION  If 7PM-7AM, please contact night-coverage [email or web address] Password [email or web address] 12/04/2018, 3:21 PM

## 2018-12-05 ENCOUNTER — Inpatient Hospital Stay (HOSPITAL_COMMUNITY): Payer: HRSA Program

## 2018-12-05 LAB — CBC WITH DIFFERENTIAL/PLATELET
Abs Immature Granulocytes: 0.03 10*3/uL (ref 0.00–0.07)
Basophils Absolute: 0 10*3/uL (ref 0.0–0.1)
Basophils Relative: 0 %
Eosinophils Absolute: 0 10*3/uL (ref 0.0–0.5)
Eosinophils Relative: 0 %
HCT: 42 % (ref 39.0–52.0)
Hemoglobin: 14 g/dL (ref 13.0–17.0)
Immature Granulocytes: 0 %
Lymphocytes Relative: 17 %
Lymphs Abs: 1.3 10*3/uL (ref 0.7–4.0)
MCH: 29.9 pg (ref 26.0–34.0)
MCHC: 33.3 g/dL (ref 30.0–36.0)
MCV: 89.6 fL (ref 80.0–100.0)
Monocytes Absolute: 0.8 10*3/uL (ref 0.1–1.0)
Monocytes Relative: 10 %
Neutro Abs: 5.6 10*3/uL (ref 1.7–7.7)
Neutrophils Relative %: 73 %
Platelets: 212 10*3/uL (ref 150–400)
RBC: 4.69 MIL/uL (ref 4.22–5.81)
RDW: 11.2 % — ABNORMAL LOW (ref 11.5–15.5)
WBC: 7.7 10*3/uL (ref 4.0–10.5)
nRBC: 0 % (ref 0.0–0.2)

## 2018-12-05 LAB — COMPREHENSIVE METABOLIC PANEL
ALT: 41 U/L (ref 0–44)
AST: 38 U/L (ref 15–41)
Albumin: 3 g/dL — ABNORMAL LOW (ref 3.5–5.0)
Alkaline Phosphatase: 33 U/L — ABNORMAL LOW (ref 38–126)
Anion gap: 14 (ref 5–15)
BUN: 53 mg/dL — ABNORMAL HIGH (ref 6–20)
CO2: 22 mmol/L (ref 22–32)
Calcium: 8.5 mg/dL — ABNORMAL LOW (ref 8.9–10.3)
Chloride: 102 mmol/L (ref 98–111)
Creatinine, Ser: 2.21 mg/dL — ABNORMAL HIGH (ref 0.61–1.24)
GFR calc Af Amer: 41 mL/min — ABNORMAL LOW (ref >60)
GFR calc non Af Amer: 36 mL/min — ABNORMAL LOW (ref >60)
Glucose, Bld: 171 mg/dL — ABNORMAL HIGH (ref 70–99)
Potassium: 3.7 mmol/L (ref 3.5–5.1)
Sodium: 138 mmol/L (ref 135–145)
Total Bilirubin: 0.9 mg/dL (ref 0.3–1.2)
Total Protein: 7.1 g/dL (ref 6.5–8.1)

## 2018-12-05 LAB — D-DIMER, QUANTITATIVE: D-Dimer, Quant: 0.64 ug/mL-FEU — ABNORMAL HIGH (ref 0.00–0.50)

## 2018-12-05 LAB — GLUCOSE, CAPILLARY
Glucose-Capillary: 152 mg/dL — ABNORMAL HIGH (ref 70–99)
Glucose-Capillary: 181 mg/dL — ABNORMAL HIGH (ref 70–99)
Glucose-Capillary: 283 mg/dL — ABNORMAL HIGH (ref 70–99)
Glucose-Capillary: 319 mg/dL — ABNORMAL HIGH (ref 70–99)
Glucose-Capillary: 353 mg/dL — ABNORMAL HIGH (ref 70–99)
Glucose-Capillary: 392 mg/dL — ABNORMAL HIGH (ref 70–99)

## 2018-12-05 LAB — MAGNESIUM: Magnesium: 2.4 mg/dL (ref 1.7–2.4)

## 2018-12-05 LAB — C-REACTIVE PROTEIN: CRP: 3.3 mg/dL — ABNORMAL HIGH (ref <1.0)

## 2018-12-05 LAB — FERRITIN: Ferritin: 5614 ng/mL — ABNORMAL HIGH (ref 24–336)

## 2018-12-05 MED ORDER — ENOXAPARIN SODIUM 60 MG/0.6ML ~~LOC~~ SOLN
55.0000 mg | Freq: Every day | SUBCUTANEOUS | Status: DC
  Administered 2018-12-05 – 2018-12-07 (×3): 55 mg via SUBCUTANEOUS
  Filled 2018-12-05 (×3): qty 0.6

## 2018-12-05 MED ORDER — ISOSORB DINITRATE-HYDRALAZINE 20-37.5 MG PO TABS
1.0000 | ORAL_TABLET | Freq: Three times a day (TID) | ORAL | Status: DC
  Administered 2018-12-05 – 2018-12-07 (×6): 1 via ORAL
  Filled 2018-12-05 (×9): qty 1

## 2018-12-05 MED ORDER — AMLODIPINE BESYLATE 10 MG PO TABS
10.0000 mg | ORAL_TABLET | Freq: Every day | ORAL | Status: DC
  Administered 2018-12-05 – 2018-12-07 (×3): 10 mg via ORAL
  Filled 2018-12-05 (×3): qty 1

## 2018-12-05 MED ORDER — TRAMADOL HCL 50 MG PO TABS: 50.0000 mg | ORAL_TABLET | Freq: Four times a day (QID) | ORAL | Status: DC | PRN

## 2018-12-05 MED ORDER — LINAGLIPTIN 5 MG PO TABS
5.0000 mg | ORAL_TABLET | Freq: Every day | ORAL | Status: DC
  Administered 2018-12-05 – 2018-12-07 (×3): 5 mg via ORAL
  Filled 2018-12-05 (×3): qty 1

## 2018-12-05 MED ORDER — INSULIN GLARGINE 100 UNIT/ML ~~LOC~~ SOLN
20.0000 [IU] | Freq: Two times a day (BID) | SUBCUTANEOUS | Status: DC
  Administered 2018-12-05 – 2018-12-06 (×3): 20 [IU] via SUBCUTANEOUS
  Filled 2018-12-05 (×3): qty 0.2

## 2018-12-05 MED ORDER — LIVING WELL WITH DIABETES BOOK
Freq: Once | Status: DC
  Filled 2018-12-05: qty 1

## 2018-12-05 MED ORDER — HYDROCODONE-ACETAMINOPHEN 5-325 MG PO TABS: 1.0000 | ORAL_TABLET | ORAL | Status: DC | PRN

## 2018-12-05 NOTE — Progress Notes (Addendum)
Nutrition Consult Diet Education  Received consult for diabetes diet education. Spoke with patient over the phone who reports that he has been diabetic for a few years. He doesn't follow a specific diet at home, but takes pills to control his blood sugar. Patient reports good appetite and good intake. Patient is tired and not ready to learn about his diet at this time. RD attached DM diet education materials to discharge instructions. Recommend outpatient diet education after discharge home.   Molli Barrows, RD, LDN, [place] Pager [phone] After Hours Pager [phone]

## 2018-12-05 NOTE — TOC Initial Note (Addendum)
Transition of Care [facility]) - Initial/Assessment Note    Patient Details  Name: Caleb Carrillo MRN: [id number] Date of Birth: [birth year]  Transition of [facility]) CM/SW Contact:    Ninfa Meeker, RN Phone Number: [phone] (working remotely) 12/05/2018, 3:20 PM  Clinical Narrative:  Patient is a 43 year old-2, HTN-who presented with cough, diarrhea-mild hypoxemia in the setting of COVID-19 pneumonia along with AKI.  Transferred from [facility] to [facility] for further treatment. Patient on Remdesivir. A1C is 14. per MD will likely need insulin, diabetes coordinator working with patient. Case manager acknowledges MD order for assistance. Case manager spoke with patient concerning getting getting telephonic hospital follow up appt scheduled. Patient states he lives in [place] and goes to the [facility] on [address] called Clinic at [phone] - left message on appointment line requesting a return call.                 Expected Discharge Plan: Home/Self Care Barriers to Discharge: Continued Medical Work up   Patient Goals and CMS Choice        Expected Discharge Plan and Services Expected Discharge Plan: Home/Self Care   Discharge Planning Services: CM Consult                                          Prior Living Arrangements/Services                       Activities of Daily Living Home Assistive Devices/Equipment: None ADL Screening (condition at time of admission) Patient's cognitive ability adequate to safely complete daily activities?: Yes Is the patient deaf or have difficulty hearing?: No Does the patient have difficulty seeing, even when wearing glasses/contacts?: No Does the patient have difficulty concentrating, remembering, or making decisions?: No Patient able to express need for assistance with ADLs?: No Does the patient have difficulty dressing or bathing?: No Independently performs ADLs?: Yes (appropriate for  developmental age) Does the patient have difficulty walking or climbing stairs?: No Weakness of Legs: Both Weakness of Arms/Hands: None  Permission Sought/Granted                  Emotional Assessment              Admission diagnosis:  Hypoxia [R09.02] Acute renal failure (ARF) (HCC) [N17.9] AKI (acute kidney injury) ([place]) [N17.9] COVID-19 [U07.1] Patient Active Problem List   Diagnosis Date Noted  . COVID-19 12/03/2018  . Type II diabetes mellitus with renal manifestations ([place]) 12/03/2018  . Multifocal pneumonia 12/03/2018  . Hypertension   . Acute renal failure (ARF) ([place])    PCP:  System, Pcp Not In Pharmacy:   [id number] - [place], [place] [address] Phone: [phone] Fax: [phone]     Social Determinants of Health (SDOH) Interventions    Readmission Risk Interventions No flowsheet data found.

## 2018-12-05 NOTE — Progress Notes (Signed)
Spoke with patient on the phone in regards to his diabetes. States that he was diagnosed about 3 years ago. He does not have a PCP outside of the hospital. States that he has been to the [facility] to see a doctor in the past. He states that he takes "2 pills for diabetes" at home, not able to tell me which ones he was taking. He said that he had not been taking the Metformin for a while. Discussed his HgbA1C of 14% and that his A1C was 7.6% last year. Encouraged him to take his medications to bring the A1C down.  Asked patient if he would be willing to take insulin at home. He said that he would if he needed to. Cost will need to be considered if patient goes home on insulin.   Patient states that he does drink a lot of juices, regular soda, and sweet tea. Suggested that he avoid juices, drink diet drinks and tea with sugar substitute. He said that he would just drink water instead. Dietician spoke with patient on the phone earlier.   Will order Living Well with Diabetes booklet for patient. Will have staff RNs work with patient so that he can practice giving injections if he is to be discharged on insulin.  Patient does need a new home blood glucose meter, strips, and lancets. (order # [id number] at discharge)  Will continue to monitor blood sugars while in the hospital.  Harvel Ricks RN BSN CDE Diabetes Coordinator Pager: [phone]  8am-5pm

## 2018-12-06 LAB — COMPREHENSIVE METABOLIC PANEL
ALT: 38 U/L (ref 0–44)
AST: 38 U/L (ref 15–41)
Albumin: 2.9 g/dL — ABNORMAL LOW (ref 3.5–5.0)
Alkaline Phosphatase: 35 U/L — ABNORMAL LOW (ref 38–126)
Anion gap: 11 (ref 5–15)
BUN: 44 mg/dL — ABNORMAL HIGH (ref 6–20)
CO2: 21 mmol/L — ABNORMAL LOW (ref 22–32)
Calcium: 8.6 mg/dL — ABNORMAL LOW (ref 8.9–10.3)
Chloride: 105 mmol/L (ref 98–111)
Creatinine, Ser: 1.55 mg/dL — ABNORMAL HIGH (ref 0.61–1.24)
GFR calc Af Amer: >60 mL/min (ref >60)
GFR calc non Af Amer: 55 mL/min — ABNORMAL LOW (ref >60)
Glucose, Bld: 307 mg/dL — ABNORMAL HIGH (ref 70–99)
Potassium: 4.3 mmol/L (ref 3.5–5.1)
Sodium: 137 mmol/L (ref 135–145)
Total Bilirubin: 0.7 mg/dL (ref 0.3–1.2)
Total Protein: 6.5 g/dL (ref 6.5–8.1)

## 2018-12-06 LAB — CBC WITH DIFFERENTIAL/PLATELET
Abs Immature Granulocytes: 0.07 10*3/uL (ref 0.00–0.07)
Basophils Absolute: 0 10*3/uL (ref 0.0–0.1)
Basophils Relative: 0 %
Eosinophils Absolute: 0 10*3/uL (ref 0.0–0.5)
Eosinophils Relative: 0 %
HCT: 39.6 % (ref 39.0–52.0)
Hemoglobin: 12.9 g/dL — ABNORMAL LOW (ref 13.0–17.0)
Immature Granulocytes: 1 %
Lymphocytes Relative: 12 %
Lymphs Abs: 1.2 10*3/uL (ref 0.7–4.0)
MCH: 29.5 pg (ref 26.0–34.0)
MCHC: 32.6 g/dL (ref 30.0–36.0)
MCV: 90.6 fL (ref 80.0–100.0)
Monocytes Absolute: 1 10*3/uL (ref 0.1–1.0)
Monocytes Relative: 10 %
Neutro Abs: 7.5 10*3/uL (ref 1.7–7.7)
Neutrophils Relative %: 77 %
Platelets: 231 10*3/uL (ref 150–400)
RBC: 4.37 MIL/uL (ref 4.22–5.81)
RDW: 11.1 % — ABNORMAL LOW (ref 11.5–15.5)
WBC: 9.8 10*3/uL (ref 4.0–10.5)
nRBC: 0.2 % (ref 0.0–0.2)

## 2018-12-06 LAB — FERRITIN: Ferritin: 3062 ng/mL — ABNORMAL HIGH (ref 24–336)

## 2018-12-06 LAB — GLUCOSE, CAPILLARY
Glucose-Capillary: 343 mg/dL — ABNORMAL HIGH (ref 70–99)
Glucose-Capillary: 306 mg/dL — ABNORMAL HIGH (ref 70–99)
Glucose-Capillary: 268 mg/dL — ABNORMAL HIGH (ref 70–99)
Glucose-Capillary: 280 mg/dL — ABNORMAL HIGH (ref 70–99)
Glucose-Capillary: 256 mg/dL — ABNORMAL HIGH (ref 70–99)

## 2018-12-06 LAB — MAGNESIUM: Magnesium: 2.2 mg/dL (ref 1.7–2.4)

## 2018-12-06 LAB — D-DIMER, QUANTITATIVE: D-Dimer, Quant: 0.4 ug/mL-FEU (ref 0.00–0.50)

## 2018-12-06 LAB — C-REACTIVE PROTEIN: CRP: 1.4 mg/dL — ABNORMAL HIGH (ref <1.0)

## 2018-12-06 MED ORDER — INSULIN GLARGINE 100 UNIT/ML ~~LOC~~ SOLN
30.0000 [IU] | Freq: Two times a day (BID) | SUBCUTANEOUS | Status: DC
  Administered 2018-12-06 – 2018-12-07 (×2): 30 [IU] via SUBCUTANEOUS
  Filled 2018-12-06 (×2): qty 0.3

## 2018-12-06 MED ORDER — INSULIN ASPART 100 UNIT/ML ~~LOC~~ SOLN
6.0000 [IU] | Freq: Three times a day (TID) | SUBCUTANEOUS | Status: DC
  Administered 2018-12-06 – 2018-12-07 (×3): 6 [IU] via SUBCUTANEOUS

## 2018-12-06 NOTE — Progress Notes (Signed)
Caleb Carrillo  GUR:[id number] DOB: 03/20/1975 DOA: 12/03/2018 PCP: System, Pcp Not In    Brief Narrative:  43 year old with a history of DM2 and HTN who presented to the ED with complaints of cough, body aches, anorexia, and diarrhea for approximately 5 days.  He was initially evaluated in urgent care center where he was found to be Covid positive, tachycardic, and hypoxic and was therefore directed to the [facility] for further evaluation.  In the ED he was found to be saturating 90% on room air and tachycardic and tachypneic.  A CXR noted diffuse pulmonary infiltrates.  Significant Events: 11/18 admit to [facility] via ED 11/19 transfer to [facility]  COVID-19 specific Treatment: Remdesivir 11/18 > Decadron 11/18 >  Subjective: Inflammatory markers are trending downward.  Remains stable on room air despite infiltrates noted on CXR. Is asking to go home as soon as possible.  States he feels comfortable giving himself insulin at home.  Denies chest pain nausea vomiting or abdominal pain.  Assessment & Plan:  Covid pneumonia Given the patient's severely uncontrolled diabetes and very stable oxygen saturations on room air I am going to discontinue his Decadron for now and monitor him closely -continue remdesivir to complete a 5-day course - appears to be clinically stabilizing   Recent Labs  Lab 12/02/18 1830 12/03/18 1920 12/04/18 0229 12/04/18 0231 12/05/18 0004 12/06/18 0203  DDIMER  --   --  0.88*  --  0.64* 0.40  FERRITIN  --  >7,500*  --  >7,500* 5,614* 3,062*  CRP  --  4.9* 5.2*  --  3.3* 1.4*  ALT 49* 46*  --  44 41 104  PROCALCITON  --  0.13 <0.10  --   --   --     Acute kidney injury Creatinine 3.25 on admission -baseline 1.5 in 2019 -felt to likely represent prerenal azotemia complicated by the use of an ACE inhibitor -no hydronephrosis on renal ultrasound -creatinine has returned to his apparent baseline  Recent Labs  Lab 12/03/18 1920 12/04/18 0231 12/04/18  1535 12/05/18 0004 12/06/18 0203  CREATININE 3.25* 3.01* 2.69* 2.21* 1.55*     DM 2 -severely uncontrolled with hyperglycemia A1c 14 -remains very poorly controlled -attempting to stop steroids today -adjusting insulin -patient reports he is comfortable using insulin at home  HTN resume his usual ACE and diuretics when renal function more stable -blood pressure has improved with BiDil for now  Transaminitis Likely due to coronavirus itself -LFTs have normalized  ?Bladder thickening on renal ultrasound No current symptoms to suggest UTI  Obesity - Body mass index is 39.63 kg/m.   DVT prophylaxis: Subcutaneous heparin Code Status: FULL CODE Family Communication:  Disposition Plan: Med/Surg   Consultants:  none  Antimicrobials:  None  Objective: Blood pressure (!) 138/93, pulse 88, temperature (!) 97.5 F (36.4 C), temperature source Oral, resp. rate 18, height 5\' 7"  (1.702 m), weight 114.8 kg, SpO2 93 %.  Intake/Output Summary (Last 24 hours) at 12/06/2018 0957 Last data filed at 12/06/2018 0900 Gross per 24 hour  Intake 1555.1 ml  Output 1950 ml  Net -394.9 ml   Filed Weights   12/04/18 0734  Weight: 114.8 kg    Examination: General: No acute respiratory distress on room air Lungs: No prominent crackles with no wheezing Cardiovascular: RRR without murmur or rub Abdomen: NT/ND, soft, BS positive, no rebound Extremities: No C/C/E B LE   CBC: Recent Labs  Lab 12/04/18 0231 12/05/18 0004 12/06/18 2831  WBC 7.0 7.7 9.8  NEUTROABS 5.3 5.6 7.5  HGB 15.1 14.0 12.9*  HCT 44.6 42.0 39.6  MCV 89.6 89.6 90.6  PLT 181 212 231   Basic Metabolic Panel: Recent Labs  Lab 12/04/18 0231 12/04/18 1535 12/05/18 0004 12/06/18 0203  NA 134* 136 138 137  K 4.3 3.7 3.7 4.3  CL 98 100 102 105  CO2 23 23 22  21*  GLUCOSE 429* 233* 171* 307*  BUN 41* 47* 53* 44*  CREATININE 3.01* 2.69* 2.21* 1.55*  CALCIUM 8.3* 8.5* 8.5* 8.6*  MG 2.2  --  2.4 2.2  PHOS 3.9   --   --   --    GFR: Estimated Creatinine Clearance: 75.9 mL/min (A) (by C-G formula based on SCr of 1.55 mg/dL (H)).  Liver Function Tests: Recent Labs  Lab 12/03/18 1920 12/04/18 0231 12/05/18 0004 12/06/18 0203  AST 70* 54* 38 38  ALT 46* 44 41 38  ALKPHOS 34* 34* 33* 35*  BILITOT 1.4* 1.0 0.9 0.7  PROT 7.0 7.2 7.1 6.5  ALBUMIN 3.2* 3.2* 3.0* 2.9*    HbA1C: Hgb A1c MFr Bld  Date/Time Value Ref Range Status  12/03/2018 09:25 PM 14.0 (H) 4.8 - 5.6 % Final    Comment:    (NOTE) Pre diabetes:          5.7%-6.4% Diabetes:              >6.4% Glycemic control for   <7.0% adults with diabetes     CBG: Recent Labs  Lab 12/05/18 0802 12/05/18 1229 12/05/18 1643 12/05/18 2029 12/06/18 0820  GLUCAP 283* 353* 392* 319* 343*    Recent Results (from the past 240 hour(s))  Blood Culture (routine x 2)     Status: None (Preliminary result)   Collection Time: 12/03/18  7:15 PM   Specimen: BLOOD LEFT HAND  Result Value Ref Range Status   Specimen Description BLOOD LEFT HAND  Final   Special Requests   Final    BOTTLES DRAWN AEROBIC AND ANAEROBIC Blood Culture adequate volume   Culture   Final    NO GROWTH 3 DAYS Performed at [facility], [address], [place], [place]    Report Status PENDING  Incomplete  Blood Culture (routine x 2)     Status: None (Preliminary result)   Collection Time: 12/03/18  7:28 PM   Specimen: BLOOD  Result Value Ref Range Status   Specimen Description BLOOD LEFT ANTECUBITAL  Final   Special Requests   Final    BOTTLES DRAWN AEROBIC AND ANAEROBIC Blood Culture adequate volume   Culture   Final    NO GROWTH 3 DAYS Performed at [facility], [address] [place], [place]    Report Status PENDING  Incomplete     Scheduled Meds: . amLODipine  10 mg Oral Daily  . dexamethasone (DECADRON) injection  6 mg Intravenous Q24H  . enoxaparin (LOVENOX) injection  55 mg Subcutaneous Daily  . insulin aspart  0-20  Units Subcutaneous TID WC  . insulin aspart  0-5 Units Subcutaneous QHS  . insulin glargine  20 Units Subcutaneous BID  . isosorbide-hydrALAZINE  1 tablet Oral TID  . linagliptin  5 mg Oral Daily  . living well with diabetes book   Does not apply Once   Continuous Infusions: . remdesivir 100 mg in NS 250 mL 100 mg (12/05/18 1633)     LOS: 3 days   12/07/18,  MD [facility] Office  [phone] Pager - Text Page per Amion  If 7PM-7AM, please contact night-coverage per Amion 12/06/2018, 9:57 AM

## 2018-12-07 LAB — CBC WITH DIFFERENTIAL/PLATELET
Abs Immature Granulocytes: 0.15 10*3/uL — ABNORMAL HIGH (ref 0.00–0.07)
Basophils Absolute: 0 10*3/uL (ref 0.0–0.1)
Basophils Relative: 0 %
Eosinophils Absolute: 0 10*3/uL (ref 0.0–0.5)
Eosinophils Relative: 0 %
HCT: 39.3 % (ref 39.0–52.0)
Hemoglobin: 12.9 g/dL — ABNORMAL LOW (ref 13.0–17.0)
Immature Granulocytes: 1 %
Lymphocytes Relative: 22 %
Lymphs Abs: 2.8 10*3/uL (ref 0.7–4.0)
MCH: 29.3 pg (ref 26.0–34.0)
MCHC: 32.8 g/dL (ref 30.0–36.0)
MCV: 89.3 fL (ref 80.0–100.0)
Monocytes Absolute: 2 10*3/uL — ABNORMAL HIGH (ref 0.1–1.0)
Monocytes Relative: 16 %
Neutro Abs: 7.5 10*3/uL (ref 1.7–7.7)
Neutrophils Relative %: 61 %
Platelets: 268 10*3/uL (ref 150–400)
RBC: 4.4 MIL/uL (ref 4.22–5.81)
RDW: 10.9 % — ABNORMAL LOW (ref 11.5–15.5)
WBC: 12.4 10*3/uL — ABNORMAL HIGH (ref 4.0–10.5)
nRBC: 0 % (ref 0.0–0.2)

## 2018-12-07 LAB — COMPREHENSIVE METABOLIC PANEL
ALT: 43 U/L (ref 0–44)
AST: 43 U/L — ABNORMAL HIGH (ref 15–41)
Albumin: 3.1 g/dL — ABNORMAL LOW (ref 3.5–5.0)
Alkaline Phosphatase: 37 U/L — ABNORMAL LOW (ref 38–126)
Anion gap: 10 (ref 5–15)
BUN: 33 mg/dL — ABNORMAL HIGH (ref 6–20)
CO2: 22 mmol/L (ref 22–32)
Calcium: 9 mg/dL (ref 8.9–10.3)
Chloride: 105 mmol/L (ref 98–111)
Creatinine, Ser: 1.46 mg/dL — ABNORMAL HIGH (ref 0.61–1.24)
GFR calc Af Amer: >60 mL/min (ref >60)
GFR calc non Af Amer: 59 mL/min — ABNORMAL LOW (ref >60)
Glucose, Bld: 227 mg/dL — ABNORMAL HIGH (ref 70–99)
Potassium: 4 mmol/L (ref 3.5–5.1)
Sodium: 137 mmol/L (ref 135–145)
Total Bilirubin: 0.9 mg/dL (ref 0.3–1.2)
Total Protein: 6.6 g/dL (ref 6.5–8.1)

## 2018-12-07 LAB — GLUCOSE, CAPILLARY
Glucose-Capillary: 180 mg/dL — ABNORMAL HIGH (ref 70–99)
Glucose-Capillary: 130 mg/dL — ABNORMAL HIGH (ref 70–99)
Glucose-Capillary: 207 mg/dL — ABNORMAL HIGH (ref 70–99)

## 2018-12-07 LAB — C-REACTIVE PROTEIN: CRP: 1 mg/dL — ABNORMAL HIGH (ref <1.0)

## 2018-12-07 LAB — FERRITIN: Ferritin: 2106 ng/mL — ABNORMAL HIGH (ref 24–336)

## 2018-12-07 LAB — D-DIMER, QUANTITATIVE: D-Dimer, Quant: 0.41 ug/mL-FEU (ref 0.00–0.50)

## 2018-12-07 MED ORDER — LISINOPRIL 10 MG PO TABS: 10.0000 mg | ORAL_TABLET | Freq: Every day | ORAL | 0 refills | Status: AC

## 2018-12-07 MED ORDER — METFORMIN HCL 1000 MG PO TABS: 1000.0000 mg | ORAL_TABLET | Freq: Two times a day (BID) | ORAL | 0 refills | Status: AC

## 2018-12-07 MED ORDER — ACETAMINOPHEN 325 MG PO TABS: 650.0000 mg | ORAL_TABLET | Freq: Four times a day (QID) | ORAL | Status: AC | PRN

## 2018-12-07 MED ORDER — INSULIN ASPART PROT & ASPART (70-30 MIX) 100 UNIT/ML ~~LOC~~ SUSP: 20.0000 [IU] | Freq: Two times a day (BID) | SUBCUTANEOUS | Status: DC

## 2018-12-07 MED ORDER — INSULIN ASPART PROT & ASPART (70-30 MIX) 100 UNIT/ML ~~LOC~~ SUSP
26.0000 [IU] | Freq: Two times a day (BID) | SUBCUTANEOUS | Status: DC
  Filled 2018-12-07: qty 10

## 2018-12-07 MED ORDER — HYDROCHLOROTHIAZIDE 25 MG PO TABS: 25.0000 mg | ORAL_TABLET | Freq: Every day | ORAL | 0 refills | Status: AC

## 2018-12-07 MED ORDER — INSULIN ASPART PROT & ASPART (70-30 MIX) 100 UNIT/ML ~~LOC~~ SUSP: 26.0000 [IU] | Freq: Two times a day (BID) | SUBCUTANEOUS | 11 refills | Status: AC

## 2018-12-07 NOTE — Care Management (Signed)
Patient was provided with glucose monitor from [facility] supply closet. Case manager will continue to work on getting him setup with the [facility] on Monday, 12/08/18.  Ricki Miller, RN BSN Case Manager  [phone]

## 2018-12-07 NOTE — Discharge Instructions (Signed)
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 Your quarantine ends 12/17/2018.     Carbohydrate Counting For People With Diabetes  Why Is Carbohydrate Counting Important?  Counting carbohydrate servings may help you control your blood glucose level so that you feel better.   The balance between the carbohydrates you eat and insulin determines what your blood glucose level will be after eating.   Carbohydrate counting can also help you plan your meals. Which Foods Have Carbohydrates? Foods with carbohydrates include:  Breads, crackers, and cereals   Pasta, rice, and grains   Starchy vegetables, such as potatoes, corn, and peas   Beans and legumes   Milk, soy milk, and yogurt   Fruits and fruit juices   Sweets, such as cakes, cookies, ice cream, jam, and jelly Carbohydrate Servings In diabetes meal planning, 1 serving of a food with carbohydrate has about 15 grams of carbohydrate:  Check serving sizes with measuring cups and spoons or a food scale.   Read the Nutrition Facts on food labels to find out how many grams of carbohydrate are in foods you eat. The food lists in this handout show portions that have about 15 grams of carbohydrate.  Tips Meal Planning Tips  An Eating Plan tells you how many carbohydrate servings to eat at your meals and snacks. For many adults, eating 3 to 5 servings of carbohydrate foods at each meal and 1 or 2 carbohydrate servings for each snack works well.   In a healthy daily Eating Plan, most carbohydrates come from:   At least 6 servings of fruits and nonstarchy vegetables   At least 6 servings of grains, beans, and starchy vegetables, with at least 3 servings from whole grains   At least 2 servings of milk or milk products  Check your blood glucose level regularly. It can tell you if you need to adjust when you eat carbohydrates.   Eating foods that have  fiber, such as whole grains, and having very few salty foods is good for your health.   Eat 4 to 6 ounces of meat or other protein foods (such as soybean burgers) each day. Choose low-fat sources of protein, such as lean beef, lean pork, chicken, fish, low-fat cheese, or vegetarian foods such as soy.   Eat some healthy fats, such as olive oil, canola oil, and nuts.   Eat very little saturated fats. These unhealthy fats are found in butter, cream, and high-fat meats, such as bacon and sausage.   Eat very little or no trans fats. These unhealthy fats are found in all foods that list partially hydrogenated oil as an ingredient.  Label Reading Tips The Nutrition Facts panel on a label lists the grams of total carbohydrate in 1 standard serving. The label's standard serving may be larger or smaller than 1 carbohydrate serving. To figure out how many carbohydrate servings are in the food:  First, look at the label's standard serving size.   Check the grams of total carbohydrate. This is the amount of carbohydrate in 1 standard serving.   Divide the grams of total carbohydrate by 15. This number equals the number of carbohydrate servings in 1 standard serving. Remember: 1 carbohydrate serving is 15 grams of carbohydrate.   Note: You may ignore the grams of sugars on the Nutrition Facts panel because they are included in the grams of total carbohydrate.  Foods Recommended 1 serving =  about 15 grams of carbohydrate Starches  1 slice bread (1 ounce)   1 tortilla (6-inch size)    large bagel (1 ounce)   2 taco shells (5-inch size)    hamburger or hot dog bun ( ounce)    cup ready-to-eat unsweetened cereal    cup cooked cereal   1 cup broth-based soup   4 to 6 small crackers   1/3 cup pasta or rice (cooked)    cup beans, peas, corn, sweet potatoes, winter squash, or mashed or boiled potatoes (cooked)    large baked potato (3 ounces)    ounce pretzels, potato chips, or  tortilla chips   3 cups popcorn (popped) Fruit  1 small fresh fruit ( to 1 cup)    cup canned or frozen fruit   2 tablespoons dried fruit (blueberries, cherries, cranberries, mixed fruit, raisins)   17 small grapes (3 ounces)   1 cup melon or berries    cup unsweetened fruit juice Milk  1 cup fat-free or reduced-fat milk   1 cup soy milk   2/3 cup (6 ounces) nonfat yogurt sweetened with sugar-free sweetener Sweets and Desserts  2-inch square cake (unfrosted)   2 small cookies (2/3 ounce)    cup ice cream or frozen yogurt    cup sherbet or sorbet   1 tablespoon syrup, jam, jelly, table sugar, or honey   2 tablespoons light syrup Other Foods  Count 1 cup raw vegetables or  cup cooked nonstarchy vegetables as zero (0) carbohydrate servings or free foods. If you eat 3 or more servings at one meal, count them as 1 carbohydrate serving.   Foods that have less than 20 calories in each serving also may be counted as zero carbohydrate servings or free foods.   Count 1 cup of casserole or other mixed foods as 2 carbohydrate servings.  Carbohydrate Counting for People with Diabetes Sample 1-Day Menu  Breakfast 1 extra-small banana (1 carbohydrate serving)  1 cup low-fat or fat-free milk (1 carbohydrate serving)  1 slice whole wheat bread (1 carbohydrate serving)  1 teaspoon margarine  Lunch 2 ounces [place] slices  2 slices whole wheat bread (2 carbohydrate servings)  2 lettuce leaves  4 celery sticks  4 carrot sticks  1 medium apple (1 carbohydrate serving)  1 cup low-fat or fat-free milk (1 carbohydrate serving)  Afternoon Snack 2 tablespoons raisins (1 carbohydrate serving)  3/4 ounce unsalted mini pretzels (1 carbohydrate serving)  Evening Meal 3 ounces lean roast beef  1/2 large baked potato (2 carbohydrate servings)  1 tablespoon reduced-fat sour cream  1/2 cup green beans  1 tablespoon light salad dressing  1 whole wheat dinner roll (1 carbohydrate  serving)  1 teaspoon margarine  1 cup melon balls (1 carbohydrate serving)  Evening Snack 2 tablespoons unsalted nuts   Carbohydrate Counting for People with Diabetes Vegan Sample 1-Day Menu  Breakfast 1 cup cooked oatmeal (2 carbohydrate servings)   cup blueberries (1 carbohydrate serving)  2 tablespoons flaxseeds  1 cup soymilk fortified with calcium and vitamin D  1 cup coffee  Lunch 2 slices whole wheat bread (2 carbohydrate servings)   cup baked tofu   cup lettuce  2 slices tomato  2 slices avocado   cup baby carrots  1 orange (1 carbohydrate serving)  1 cup soymilk fortified with calcium and vitamin D   Evening Meal Burrito made with: 1 6-inch corn tortilla (1 carbohydrate serving)  1 cup refried vegetarian beans (1 carbohydrate  serving)   cup chopped tomatoes   cup lettuce   cup salsa  1/3 cup brown rice (1 carbohydrate serving)  1 tablespoon olive oil for rice   cup zucchini   Evening Snack 6 small whole grain crackers (1 carbohydrate serving)  2 apricots ( carbohydrate serving)   cup unsalted peanuts ( carbohydrate serving)     Carbohydrate Counting for People with Diabetes Vegetarian (Lacto-Ovo) Sample 1-Day Menu  Breakfast 1 cup cooked oatmeal (2 carbohydrate servings)   cup blueberries (1 carbohydrate serving)  2 tablespoons flaxseeds  1 egg  1 cup 1% milk (1 carbohydrate serving)  1 cup coffee  Lunch 2 slices whole wheat bread (2 carbohydrate servings)  2 ounces low-fat cheese   cup lettuce  2 slices tomato  2 slices avocado   cup baby carrots  1 orange (1 carbohydrate serving)  1 cup unsweetened tea  Evening Meal Burrito made with: 1 6-inch corn tortilla (1 carbohydrate serving)   cup refried vegetarian beans (1 carbohydrate serving)   cup tomatoes   cup lettuce   cup salsa  1/3 cup brown rice (1 carbohydrate serving)  1 tablespoon olive oil for rice   cup zucchini  1 cup 1% milk (1 carbohydrate serving)  Evening Snack 6 small  whole grain crackers (1 carbohydrate serving)  2 apricots ( carbohydrate serving)   cup unsalted peanuts ( carbohydrate serving)    Copyright 2020  Academy of Nutrition and Dietetics. All rights reserved.  Using Nutrition Labels: Carbohydrate   Serving Size   Look at the serving size. All the information on the label is based on this portion.  Servings Per Container   The number of servings contained in the package.  Guidelines for Carbohydrate   Look at the total grams of carbohydrate in the serving size.   1 carbohydrate choice = 15 grams of carbohydrate. Range of Carbohydrate Grams Per Choice  Carbohydrate Grams/Choice Carbohydrate Choices  6-10   11-20 1  21-25 1  26-35 2  36-40 2  41-50 3  51-55 3  56-65 4  66-70 4  71-80 5    Copyright 2020  Academy of Nutrition and Dietetics. All rights reserved.    COVID-19 COVID-19 is a respiratory infection that is caused by a virus called severe acute respiratory syndrome coronavirus 2 (SARS-CoV-2). The disease is also known as coronavirus disease or novel coronavirus. In some people, the virus may not cause any symptoms. In others, it may cause a serious infection. The infection can get worse quickly and can lead to complications, such as:  Pneumonia, or infection of the lungs.  Acute respiratory distress syndrome or ARDS. This is fluid build-up in the lungs.  Acute respiratory failure. This is a condition in which there is not enough oxygen passing from the lungs to the body.  Sepsis or septic shock. This is a serious bodily reaction to an infection.  Blood clotting problems.  Secondary infections due to bacteria or fungus. The virus that causes COVID-19 is contagious. This means that it can spread from person to person through droplets from coughs and sneezes (respiratory secretions). What are the causes? This illness is caused by a virus. You may catch the virus by:  Breathing in droplets from an  infected person's cough or sneeze.  Touching something, like a table or a doorknob, that was exposed to the virus (contaminated) and then touching your mouth, nose, or eyes. What increases the risk? Risk for infection You are more likely to  be infected with this virus if you:  Live in or travel to an area with a COVID-19 outbreak.  Come in contact with a sick person who recently traveled to an area with a COVID-19 outbreak.  Provide care for or live with a person who is infected with COVID-19. Risk for serious illness You are more likely to become seriously ill from the virus if you:  Are 73 years of age or older.  Have a long-term disease that lowers your body's ability to fight infection (immunocompromised).  Live in a nursing home or long-term care facility.  Have a long-term (chronic) disease such as: ? Chronic lung disease, including chronic obstructive pulmonary disease or asthma ? Heart disease. ? Diabetes. ? Chronic kidney disease. ? Liver disease.  Are obese. What are the signs or symptoms? Symptoms of this condition can range from mild to severe. Symptoms may appear any time from 2 to 14 days after being exposed to the virus. They include:  A fever.  A cough.  Difficulty breathing.  Chills.  Muscle pains.  A sore throat.  Loss of taste or smell. Some people may also have stomach problems, such as nausea, vomiting, or diarrhea. Other people may not have any symptoms of COVID-19. How is this diagnosed? This condition may be diagnosed based on:  Your signs and symptoms, especially if: ? You live in an area with a COVID-19 outbreak. ? You recently traveled to or from an area where the virus is common. ? You provide care for or live with a person who was diagnosed with COVID-19.  A physical exam.  Lab tests, which may include: ? A nasal swab to take a sample of fluid from your nose. ? A throat swab to take a sample of fluid from your throat. ? A sample  of mucus from your lungs (sputum). ? Blood tests.  Imaging tests, which may include, X-rays, CT scan, or ultrasound. How is this treated? At present, there is no medicine to treat COVID-19. Medicines that treat other diseases are being used on a trial basis to see if they are effective against COVID-19. Your health care provider will talk with you about ways to treat your symptoms. For most people, the infection is mild and can be managed at home with rest, fluids, and over-the-counter medicines. Treatment for a serious infection usually takes places in a hospital intensive care unit (ICU). It may include one or more of the following treatments. These treatments are given until your symptoms improve.  Receiving fluids and medicines through an IV.  Supplemental oxygen. Extra oxygen is given through a tube in the nose, a face mask, or a hood.  Positioning you to lie on your stomach (prone position). This makes it easier for oxygen to get into the lungs.  Continuous positive airway pressure (CPAP) or bi-level positive airway pressure (BPAP) machine. This treatment uses mild air pressure to keep the airways open. A tube that is connected to a motor delivers oxygen to the body.  Ventilator. This treatment moves air into and out of the lungs by using a tube that is placed in your windpipe.  Tracheostomy. This is a procedure to create a hole in the neck so that a breathing tube can be inserted.  Extracorporeal membrane oxygenation (ECMO). This procedure gives the lungs a chance to recover by taking over the functions of the heart and lungs. It supplies oxygen to the body and removes carbon dioxide. Follow these instructions at home: Lifestyle  If  you are sick, stay home except to get medical care. Your health care provider will tell you how long to stay home. Call your health care provider before you go for medical care.  Rest at home as told by your health care provider.  Do not use any  products that contain nicotine or tobacco, such as cigarettes, e-cigarettes, and chewing tobacco. If you need help quitting, ask your health care provider.  Return to your normal activities as told by your health care provider. Ask your health care provider what activities are safe for you. General instructions  Take over-the-counter and prescription medicines only as told by your health care provider.  Drink enough fluid to keep your urine pale yellow.  Keep all follow-up visits as told by your health care provider. This is important. How is this prevented?  There is no vaccine to help prevent COVID-19 infection. However, there are steps you can take to protect yourself and others from this virus. To protect yourself:   Do not travel to areas where COVID-19 is a risk. The areas where COVID-19 is reported change often. To identify high-risk areas and travel restrictions, check the CDC travel website: StageSync.si  If you live in, or must travel to, an area where COVID-19 is a risk, take precautions to avoid infection. ? Stay away from people who are sick. ? Wash your hands often with soap and water for 20 seconds. If soap and water are not available, use an alcohol-based hand sanitizer. ? Avoid touching your mouth, face, eyes, or nose. ? Avoid going out in public, follow guidance from your state and local health authorities. ? If you must go out in public, wear a cloth face covering or face mask. ? Disinfect objects and surfaces that are frequently touched every day. This may include:  Counters and tables.  Doorknobs and light switches.  Sinks and faucets.  Electronics, such as phones, remote controls, keyboards, computers, and tablets. To protect others: If you have symptoms of COVID-19, take steps to prevent the virus from spreading to others.  If you think you have a COVID-19 infection, contact your health care provider right away. Tell your health care team that  you think you may have a COVID-19 infection.  Stay home. Leave your house only to seek medical care. Do not use public transport.  Do not travel while you are sick.  Wash your hands often with soap and water for 20 seconds. If soap and water are not available, use alcohol-based hand sanitizer.  Stay away from other members of your household. Let healthy household members care for children and pets, if possible. If you have to care for children or pets, wash your hands often and wear a mask. If possible, stay in your own room, separate from others. Use a different bathroom.  Make sure that all people in your household wash their hands well and often.  Cough or sneeze into a tissue or your sleeve or elbow. Do not cough or sneeze into your hand or into the air.  Wear a cloth face covering or face mask. Where to find more information  Centers for Disease Control and Prevention: StickerEmporium.tn  World Health Organization: https://thompson-craig.com/ Contact a health care provider if:  You live in or have traveled to an area where COVID-19 is a risk and you have symptoms of the infection.  You have had contact with someone who has COVID-19 and you have symptoms of the infection. Get help right away if:  You  have trouble breathing.  You have pain or pressure in your chest.  You have confusion.  You have bluish lips and fingernails.  You have difficulty waking from sleep.  You have symptoms that get worse. These symptoms may represent a serious problem that is an emergency. Do not wait to see if the symptoms will go away. Get medical help right away. Call your local emergency services (911 in the [place] Do not drive yourself to the hospital. Let the emergency medical personnel know if you think you have COVID-19. Summary  COVID-19 is a respiratory infection that is caused by a virus. It is also known as coronavirus disease or novel coronavirus.  It can cause serious infections, such as pneumonia, acute respiratory distress syndrome, acute respiratory failure, or sepsis.  The virus that causes COVID-19 is contagious. This means that it can spread from person to person through droplets from coughs and sneezes.  You are more likely to develop a serious illness if you are 365 years of age or older, have a weak immunity, live in a nursing home, or have chronic disease.  There is no medicine to treat COVID-19. Your health care provider will talk with you about ways to treat your symptoms.  Take steps to protect yourself and others from infection. Wash your hands often and disinfect objects and surfaces that are frequently touched every day. Stay away from people who are sick and wear a mask if you are sick. This information is not intended to replace advice given to you by your health care provider. Make sure you discuss any questions you have with your health care provider. Document Released: 02/06/2018 Document Revised: 05/29/2018 Document Reviewed: 02/06/2018 Elsevier Patient Education  2020 Elsevier Inc.  COVID-19: How to Protect Yourself and Others Know how it spreads  There is currently no vaccine to prevent coronavirus disease 2019 (COVID-19).  The best way to prevent illness is to avoid being exposed to this virus.  The virus is thought to spread mainly from person-to-person. ? Between people who are in close contact with one another (within about 6 feet). ? Through respiratory droplets produced when an infected person coughs, sneezes or talks. ? These droplets can land in the mouths or noses of people who are nearby or possibly be inhaled into the lungs. ? Some recent studies have suggested that COVID-19 may be spread by people who are not showing symptoms. Everyone should Clean your hands often  Wash your hands often with soap and water for at least 20 seconds especially after you have been in a public place, or after blowing  your nose, coughing, or sneezing.  If soap and water are not readily available, use a hand sanitizer that contains at least 60% alcohol. Cover all surfaces of your hands and rub them together until they feel dry.  Avoid touching your eyes, nose, and mouth with unwashed hands. Avoid close contact  Stay home if you are sick.  Avoid close contact with people who are sick.  Put distance between yourself and other people. ? Remember that some people without symptoms may be able to spread virus. ? This is especially important for people who are at higher risk of getting very RetroStamps.itsick.www.cdc.gov/coronavirus/2019-ncov/need-extra-precautions/people-at-higher-risk.html Cover your mouth and nose with a cloth face cover when around others  You could spread COVID-19 to others even if you do not feel sick.  Everyone should wear a cloth face cover when they have to go out in public, for example to the grocery store  or to pick up other necessities. ? Cloth face coverings should not be placed on young children under age 75, anyone who has trouble breathing, or is unconscious, incapacitated or otherwise unable to remove the mask without assistance.  The cloth face cover is meant to protect other people in case you are infected.  Do NOT use a facemask meant for a Research scientist (physical sciences).  Continue to keep about 6 feet between yourself and others. The cloth face cover is not a substitute for social distancing. Cover coughs and sneezes  If you are in a private setting and do not have on your cloth face covering, remember to always cover your mouth and nose with a tissue when you cough or sneeze or use the inside of your elbow.  Throw used tissues in the trash.  Immediately wash your hands with soap and water for at least 20 seconds. If soap and water are not readily available, clean your hands with a hand sanitizer that contains at least 60% alcohol. Clean and disinfect  Clean AND disinfect frequently touched  surfaces daily. This includes tables, doorknobs, light switches, countertops, handles, desks, phones, keyboards, toilets, faucets, and sinks. ktimeonline.com  If surfaces are dirty, clean them: Use detergent or soap and water prior to disinfection.  Then, use a household disinfectant. You can see a list of EPA-registered household disinfectants here. SouthAmericaFlowers.co.uk 05/20/2018 This information is not intended to replace advice given to you by your health care provider. Make sure you discuss any questions you have with your health care provider. Document Released: 04/29/2018 Document Revised: 05/28/2018 Document Reviewed: 04/29/2018 Elsevier Patient Education  2020 [employer].   COVID-19 Frequently Asked Questions COVID-19 (coronavirus disease) is an infection that is caused by a large family of viruses. Some viruses cause illness in people and others cause illness in animals like camels, cats, and bats. In some cases, the viruses that cause illness in animals can spread to humans. Where did the coronavirus come from? In December 2019, [place] told the [employer] [facility]) of several cases of lung disease (human respiratory illness). These cases were linked to an open seafood and livestock market in the city of [place]. The link to the seafood and livestock market suggests that the virus may have spread from animals to humans. However, since that first outbreak in December, the virus has also been shown to spread from person to person. What is the name of the disease and the virus? Disease name Early on, this disease was called novel coronavirus. This is because scientists determined that the disease was caused by a new (novel) respiratory virus. The World Health Organization [facility]) has now named the disease COVID-19, or coronavirus disease. Virus name The virus that causes the disease is called severe acute  respiratory syndrome coronavirus 2 (SARS-CoV-2). More information on disease and virus naming World Health Organization [facility]): www.who.int/emergencies/diseases/novel-coronavirus-2019/technical-guidance/naming-the-coronavirus-disease-(covid-2019)-and-the-virus-that-causes-it Who is at risk for complications from coronavirus disease? Some people may be at higher risk for complications from coronavirus disease. This includes older adults and people who have chronic diseases, such as heart disease, diabetes, and lung disease. If you are at higher risk for complications, take these extra precautions:  Avoid close contact with people who are sick or have a fever or cough. Stay at least 3-6 ft (1-2 m) away from them, if possible.  Wash your hands often with soap and water for at least 20 seconds.  Avoid touching your face, mouth, nose, or eyes.  Keep supplies on hand at home, such  as food, medicine, and cleaning supplies.  Stay home as much as possible.  Avoid social gatherings and travel. How does coronavirus disease spread? The virus that causes coronavirus disease spreads easily from person to person (is contagious). There are also cases of community-spread disease. This means the disease has spread to:  People who have no known contact with other infected people.  People who have not traveled to areas where there are known cases. It appears to spread from one person to another through droplets from coughing or sneezing. Can I get the virus from touching surfaces or objects? There is still a lot that we do not know about the virus that causes coronavirus disease. Scientists are basing a lot of information on what they know about similar viruses, such as:  Viruses cannot generally survive on surfaces for long. They need a human body (host) to survive.  It is more likely that the virus is spread by close contact with people who are sick (direct contact), such as through: ? Shaking hands or  hugging. ? Breathing in respiratory droplets that travel through the air. This can happen when an infected person coughs or sneezes on or near other people.  It is less likely that the virus is spread when a person touches a surface or object that has the virus on it (indirect contact). The virus may be able to enter the body if the person touches a surface or object and then touches his or her face, eyes, nose, or mouth. Can a person spread the virus without having symptoms of the disease? It may be possible for the virus to spread before a person has symptoms of the disease, but this is most likely not the main way the virus is spreading. It is more likely for the virus to spread by being in close contact with people who are sick and breathing in the respiratory droplets of a sick person's cough or sneeze. What are the symptoms of coronavirus disease? Symptoms vary from person to person and can range from mild to severe. Symptoms may include:  Fever.  Cough.  Tiredness, weakness, or fatigue.  Fast breathing or feeling short of breath. These symptoms can appear anywhere from 2 to 14 days after you have been exposed to the virus. If you develop symptoms, call your health care provider. People with severe symptoms may need hospital care. If I am exposed to the virus, how long does it take before symptoms start? Symptoms of coronavirus disease may appear anywhere from 2 to 14 days after a person has been exposed to the virus. If you develop symptoms, call your health care provider. Should I be tested for this virus? Your health care provider will decide whether to test you based on your symptoms, history of exposure, and your risk factors. How does a health care provider test for this virus? Health care providers will collect samples to send for testing. Samples may include:  Taking a swab of fluid from the nose.  Taking fluid from the lungs by having you cough up mucus (sputum) into a  sterile cup.  Taking a blood sample.  Taking a stool or urine sample. Is there a treatment or vaccine for this virus? Currently, there is no vaccine to prevent coronavirus disease. Also, there are no medicines like antibiotics or antivirals to treat the virus. A person who becomes sick is given supportive care, which means rest and fluids. A person may also relieve his or her symptoms by using over-the-counter  medicines that treat sneezing, coughing, and runny nose. These are the same medicines that a person takes for the common cold. If you develop symptoms, call your health care provider. People with severe symptoms may need hospital care. What can I do to protect myself and my family from this virus?     You can protect yourself and your family by taking the same actions that you would take to prevent the spread of other viruses. Take the following actions:  Wash your hands often with soap and water for at least 20 seconds. If soap and water are not available, use alcohol-based hand sanitizer.  Avoid touching your face, mouth, nose, or eyes.  Cough or sneeze into a tissue, sleeve, or elbow. Do not cough or sneeze into your hand or the air. ? If you cough or sneeze into a tissue, throw it away immediately and wash your hands.  Disinfect objects and surfaces that you frequently touch every day.  Avoid close contact with people who are sick or have a fever or cough. Stay at least 3-6 ft (1-2 m) away from them, if possible.  Stay home if you are sick, except to get medical care. Call your health care provider before you get medical care.  Make sure your vaccines are up to date. Ask your health care provider what vaccines you need. What should I do if I need to travel? Follow travel recommendations from your local health authority, the CDC, and WHO. Travel information and advice  Centers for Disease Control and Prevention (CDC):  GeminiCard.gl  World Health Organization [facility]): PreviewDomains.se Know the risks and take action to protect your health  You are at higher risk of getting coronavirus disease if you are traveling to areas with an outbreak or if you are exposed to travelers from areas with an outbreak.  Wash your hands often and practice good hygiene to lower the risk of catching or spreading the virus. What should I do if I am sick? General instructions to stop the spread of infection  Wash your hands often with soap and water for at least 20 seconds. If soap and water are not available, use alcohol-based hand sanitizer.  Cough or sneeze into a tissue, sleeve, or elbow. Do not cough or sneeze into your hand or the air.  If you cough or sneeze into a tissue, throw it away immediately and wash your hands.  Stay home unless you must get medical care. Call your health care provider or local health authority before you get medical care.  Avoid public areas. Do not take public transportation, if possible.  If you can, wear a mask if you must go out of the house or if you are in close contact with someone who is not sick. Keep your home clean  Disinfect objects and surfaces that are frequently touched every day. This may include: ? Counters and tables. ? Doorknobs and light switches. ? Sinks and faucets. ? Electronics such as phones, remote controls, keyboards, computers, and tablets.  Wash dishes in hot, soapy water or use a dishwasher. Air-dry your dishes.  Wash laundry in hot water. Prevent infecting other household members  Let healthy household members care for children and pets, if possible. If you have to care for children or pets, wash your hands often and wear a mask.  Sleep in a different bedroom or bed, if possible.  Do not share personal items, such as razors, toothbrushes, deodorant, combs,  brushes, towels, and washcloths. Where to find  more information Centers for Disease Control and Prevention (CDC)  Information and news updates: https://www.butler-gonzalez.com/ World Health Organization [facility])  Information and news updates: MissExecutive.com.ee  Coronavirus health topic: https://www.castaneda.info/  Questions and answers on COVID-19: OpportunityDebt.at  Global tracker: who.sprinklr.com American Academy of Pediatrics (AAP)  Information for families: www.healthychildren.org/English/health-issues/conditions/chest-lungs/Pages/2019-Novel-Coronavirus.aspx The coronavirus situation is changing rapidly. Check your local health authority website or the CDC and [facility] websites for updates and news. When should I contact a health care provider?  Contact your health care provider if you have symptoms of an infection, such as fever or cough, and you: ? Have been near anyone who is known to have coronavirus disease. ? Have come into contact with a person who is suspected to have coronavirus disease. ? Have traveled outside of the country. When should I get emergency medical care?  Get help right away by calling your local emergency services (911 in the [place] if you have: ? Trouble breathing. ? Pain or pressure in your chest. ? Confusion. ? Blue-tinged lips and fingernails. ? Difficulty waking from sleep. ? Symptoms that get worse. Let the emergency medical personnel know if you think you have coronavirus disease. Summary  A new respiratory virus is spreading from person to person and causing COVID-19 (coronavirus disease).  The virus that causes COVID-19 appears to spread easily. It spreads from one person to another through droplets from coughing or sneezing.  Older adults and those with chronic diseases are at higher risk of disease. If you are at higher risk for complications, take extra  precautions.  There is currently no vaccine to prevent coronavirus disease. There are no medicines, such as antibiotics or antivirals, to treat the virus.  You can protect yourself and your family by washing your hands often, avoiding touching your face, and covering your coughs and sneezes. This information is not intended to replace advice given to you by your health care provider. Make sure you discuss any questions you have with your health care provider. Document Released: 04/29/2018 Document Revised: 04/29/2018 Document Reviewed: 04/29/2018 Elsevier Patient Education  [place].

## 2018-12-07 NOTE — Progress Notes (Signed)
All discharge instructions and education given to patient, IVs d/c'd at this time with no complications. Patient in no acute distress, resting comfortably on room air. Room searched for patient belongings to be sent with patient. Will continue to monitor until family arrives to pick up patient.

## 2018-12-07 NOTE — Discharge Summary (Signed)
DISCHARGE SUMMARY  Caleb Carrillo  MR#: [id number]  DOB:1975/07/15  Date of Admission: 12/03/2018 Date of Discharge: 12/07/2018  Attending Physician:Jeffrey Hennie Duos, MD  Patient's WUJ:[id number], Pcp Not In  Consults: none   Disposition: D/C home   Date of Positive COVID Test: 12/03/2018  Date Quarantine Ends: 12/17/2018  Follow-up Appts: Follow-up [place]. Schedule an appointment as soon as possible for a visit in 5 day(s).   Why: You will need to have your blood sugars, oxygen level, and BP checked in 5-7 days.  DM 2 -severely uncontrolled,  A1c 14 Contact information: [place], [place] [address]   [phone]          Tests Needing Follow-up: -assess control of CBG and need to adjust insulin dosing -check BP and need to adjust med tx -Follow-up renal function  Discharge Diagnoses: Covid pneumonia Acute kidney injury DM 2 -severely uncontrolled with hyperglycemia HTN Transaminitis Obesity - Body mass index is 39.63 kg/m.  Initial presentation: 43 year old with a history of DM2 and HTN who presented to the ED with complaints of cough, body aches, anorexia, and diarrhea for approximately 5 days.  He was initially evaluated in urgent care center where he was found to be Covid positive, tachycardic, and hypoxic and was therefore directed to the [facility] ED for further evaluation.  In the ED he was found to be saturating 90% on room air and tachycardic and tachypneic.  A CXR noted diffuse pulmonary infiltrates.  COVID-19 specific Treatment: Remdesivir 11/18 > 11/22 Decadron 11/18 > 11/21  Hospital Course:  Covid pneumonia Given the patient's severely uncontrolled diabetes and very stable oxygen saturations on room air I discontinued his Decadron 11/21 -he completed a 5-day course of remdesivir - clinically stable at time of discharge with saturations greater than 90% on room air and absolutely no respiratory  symptoms  Acute kidney injury Creatinine 3.25 on admission -baseline 1.5 in 2019 -felt to likely represent prerenal azotemia complicated by the use of an ACE inhibitor -no hydronephrosis on renal ultrasound -creatinine returned to his apparent baseline prior to discharge  DM 2 -severely uncontrolled with hyperglycemia A1c 14 -with discontinuation of steroid and titration of insulin dosing his CBG became much better controlled -he was educated by the nursing staff on insulin dosing and reported that he was comfortable doing so -he was transitioned to 7030 and attempt to make his insulin treatment more affordable -he is counseled on the importance of following a diabetic diet and following his CBGs closely  HTN resumed his usual ACE and diuretics at the time of discharge since his renal function has stabilized -tolerated BiDil well during hospital stay so this could be considered in the future if patient obtains a payer source  Transaminitis Likely due to coronavirus itself -LFTs have normalized prior to discharge  ?Bladder thickening on renal ultrasound No current symptoms to suggest UTI  Obesity - Body mass index is 39.63 kg/m.   Allergies as of 12/07/2018   No Known Allergies     Medication List    TAKE these medications   acetaminophen 325 MG tablet Commonly known as: TYLENOL Take 2 tablets (650 mg total) by mouth every 6 (six) hours as needed for mild pain, fever or headache.   hydrochlorothiazide 25 MG tablet Commonly known as: HYDRODIURIL Take 1 tablet (25 mg total) by mouth daily.   insulin aspart protamine- aspart (70-30) 100 UNIT/ML injection Commonly known as: NOVOLOG MIX 70/30 Inject 0.26 mLs (  26 Units total) into the skin 2 (two) times daily with a meal.   lisinopril 10 MG tablet Commonly known as: ZESTRIL Take 1 tablet (10 mg total) by mouth daily.   metFORMIN 1000 MG tablet Commonly known as: GLUCOPHAGE Take 1 tablet (1,000 mg total) by mouth 2 (two)  times daily.       Day of Discharge BP (!) 152/98 (BP Location: Right Arm)   Pulse 83   Temp 98.7 F (37.1 C) (Oral)   Resp (!) 26   Ht 5\' 7"  (1.702 m)   Wt 114.8 kg   SpO2 97%   BMI 39.63 kg/m   Physical Exam: General: No acute respiratory distress Lungs: Clear to auscultation bilaterally without wheezes or crackles Cardiovascular: Regular rate and rhythm without murmur gallop or rub normal S1 and S2 Abdomen: Nontender, nondistended, soft, bowel sounds positive, no rebound, no ascites, no appreciable mass Extremities: No significant cyanosis, clubbing, or edema bilateral lower extremities  Basic Metabolic Panel: Recent Labs  Lab 12/04/18 0231 12/04/18 1535 12/05/18 0004 12/06/18 0203 12/07/18 0102  NA 134* 136 138 137 137  K 4.3 3.7 3.7 4.3 4.0  CL 98 100 102 105 105  CO2 23 23 22  21* 22  GLUCOSE 429* 233* 171* 307* 227*  BUN 41* 47* 53* 44* 33*  CREATININE 3.01* 2.69* 2.21* 1.55* 1.46*  CALCIUM 8.3* 8.5* 8.5* 8.6* 9.0  MG 2.2  --  2.4 2.2  --   PHOS 3.9  --   --   --   --     Liver Function Tests: Recent Labs  Lab 12/03/18 1920 12/04/18 0231 12/05/18 0004 12/06/18 0203 12/07/18 0102  AST 70* 54* 38 38 43*  ALT 46* 44 41 38 43  ALKPHOS 34* 34* 33* 35* 37*  BILITOT 1.4* 1.0 0.9 0.7 0.9  PROT 7.0 7.2 7.1 6.5 6.6  ALBUMIN 3.2* 3.2* 3.0* 2.9* 3.1*    CBC: Recent Labs  Lab 12/03/18 1932 12/04/18 0231 12/05/18 0004 12/06/18 0203 12/07/18 0102  WBC 7.3 7.0 7.7 9.8 12.4*  NEUTROABS 4.5 5.3 5.6 7.5 7.5  HGB 15.2 15.1 14.0 12.9* 12.9*  HCT 45.0 44.6 42.0 39.6 39.3  MCV 89.8 89.6 89.6 90.6 89.3  PLT 195 181 212 231 268    CBG: Recent Labs  Lab 12/06/18 2111 12/06/18 2322 12/07/18 0320 12/07/18 0804 12/07/18 1129  GLUCAP 280* 256* 180* 130* 207*    Recent Results (from the past 240 hour(s))  Blood Culture (routine x 2)     Status: None (Preliminary result)   Collection Time: 12/03/18  7:15 PM   Specimen: BLOOD LEFT HAND  Result Value Ref  Range Status   Specimen Description BLOOD LEFT HAND  Final   Special Requests   Final    BOTTLES DRAWN AEROBIC AND ANAEROBIC Blood Culture adequate volume   Culture   Final    NO GROWTH 4 DAYS Performed at [facility], [address] [place], [place] [id number]    Report Status PENDING  Incomplete  Blood Culture (routine x 2)     Status: None (Preliminary result)   Collection Time: 12/03/18  7:28 PM   Specimen: BLOOD  Result Value Ref Range Status   Specimen Description BLOOD LEFT ANTECUBITAL  Final   Special Requests   Final    BOTTLES DRAWN AEROBIC AND ANAEROBIC Blood Culture adequate volume   Culture   Final    NO GROWTH 4 DAYS Performed at [facility], [address], [place], [place] [address]    Report Status PENDING  Incomplete     Time spent in discharge (includes decision making & examination of pt): 35 minutes  12/07/2018, 4:56 PM   Lonia Blood, MD [facility] Office  [phone]

## 2018-12-08 LAB — CULTURE, BLOOD (ROUTINE X 2)
Culture: NO GROWTH
Culture: NO GROWTH
Special Requests: ADEQUATE
Special Requests: ADEQUATE

## 2019-01-20 ENCOUNTER — Other Ambulatory Visit: Payer: Self-pay

## 2020-11-24 IMAGING — DX DG CHEST 1V PORT
1 series · 1 of 1 positions shown · non-contrast
Comparison: None

CLINICAL DATA: Chest pain with 4 days of cough, body aches, fatigue
and abdominal pain

EXAM:
PORTABLE CHEST 1 VIEW

[chest]
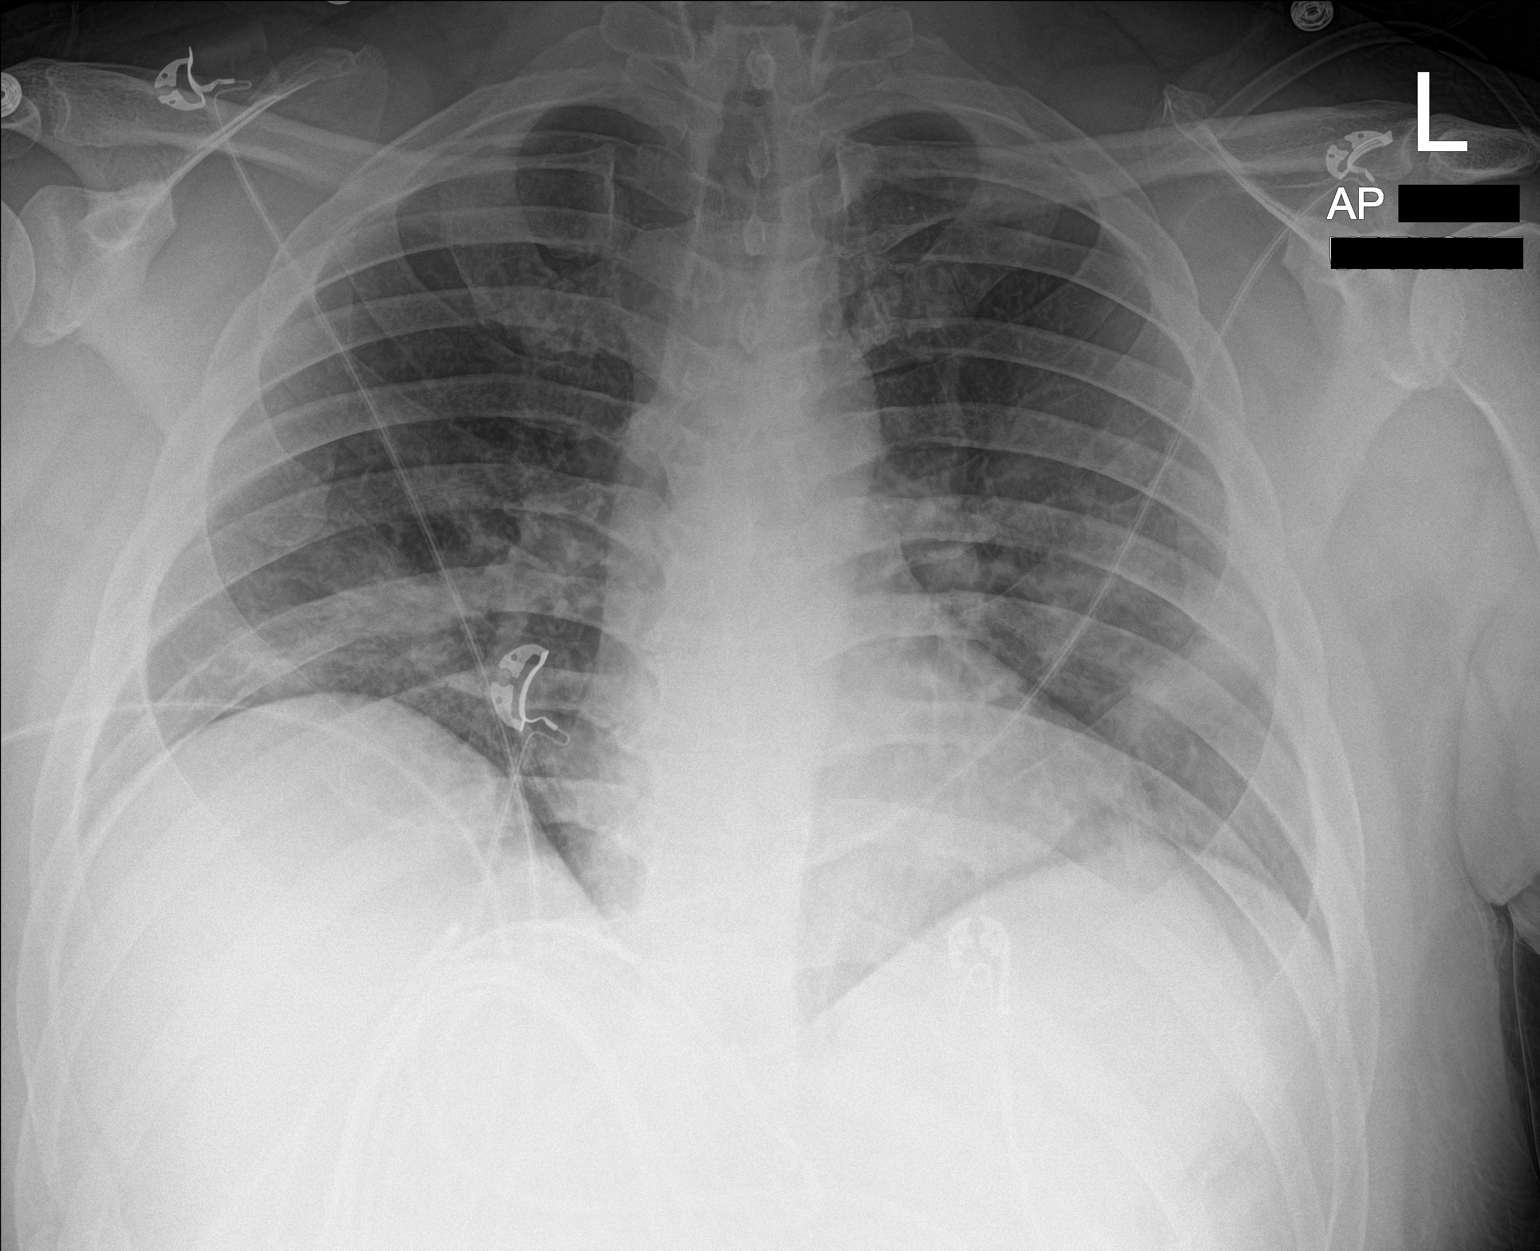

[1 of 1 positions shown; findings below may reference images not displayed]

FINDINGS: Multifocal areas of patchy airspace consolidation most pronounced in
the left lung periphery. No pneumothorax or effusion. The
cardiomediastinal contours are unremarkable. No acute osseous or
soft tissue abnormality. Degenerative changes are present in the
imaged spine and shoulders.
IMPRESSION: Findings concerning for a multifocal pneumonia in the appropriate
clinical setting.

## 2020-11-24 IMAGING — US US RENAL
1 series · 14 of 25 positions shown · non-contrast
Comparison: None.

CLINICAL DATA: Renal failure

EXAM:
RENAL / URINARY TRACT ULTRASOUND COMPLETE

[Series 1: us renal · 14 of 30 slices shown]
[im 1/30]
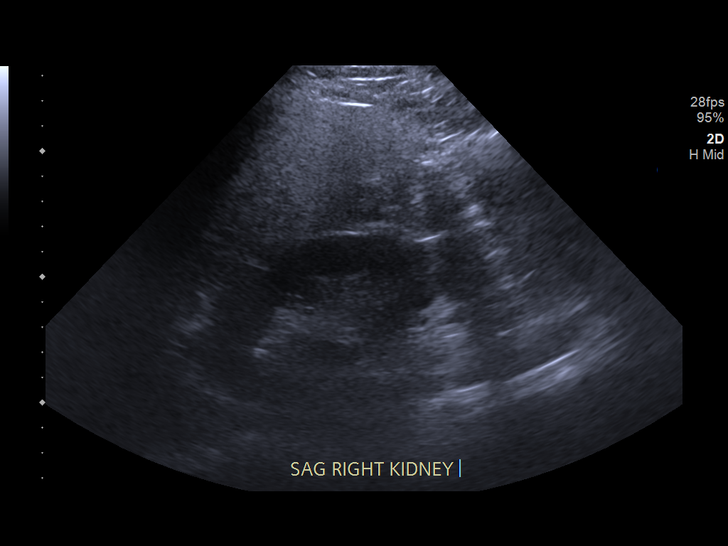
[im 3/30]
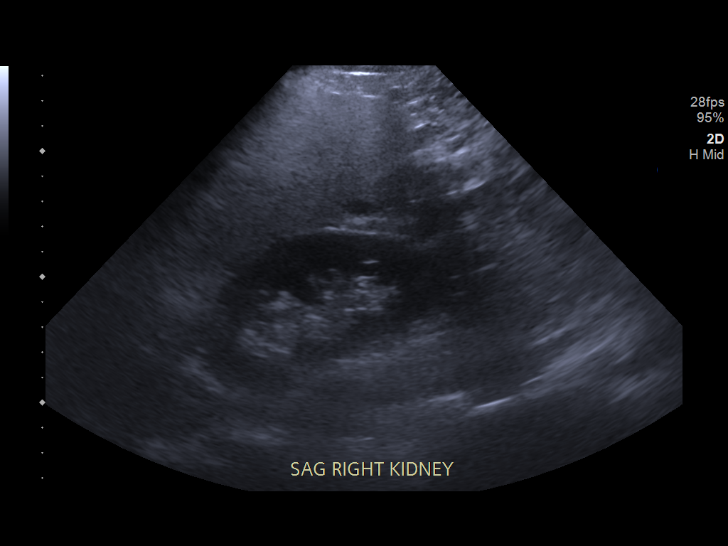
[im 5/30]
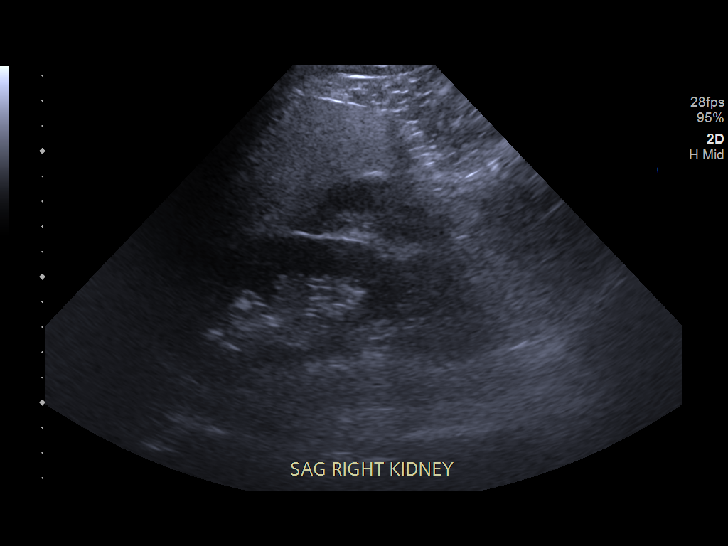
[im 8/30]
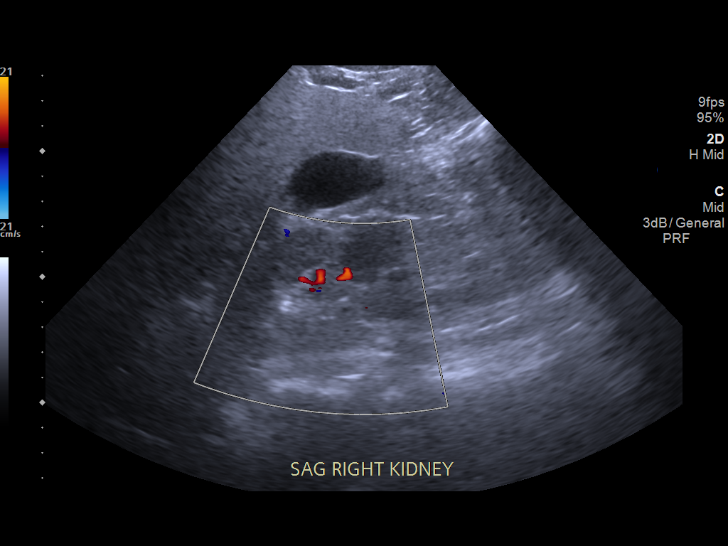
[im 10/30]
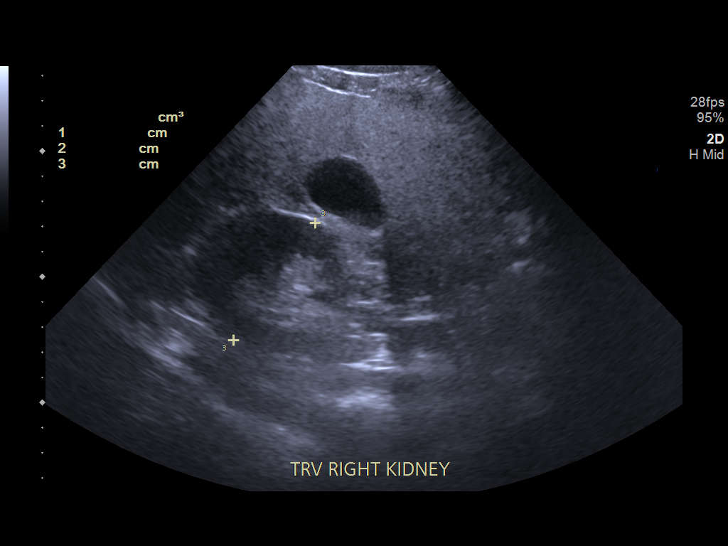
[im 11/30]
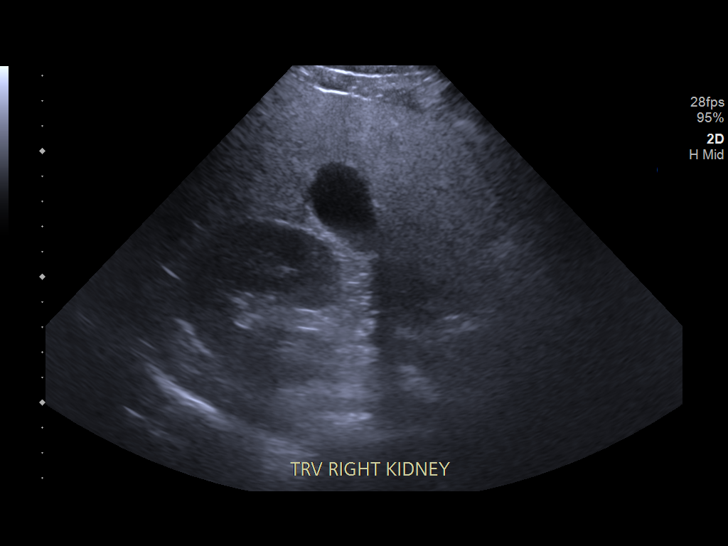
[im 14/30]
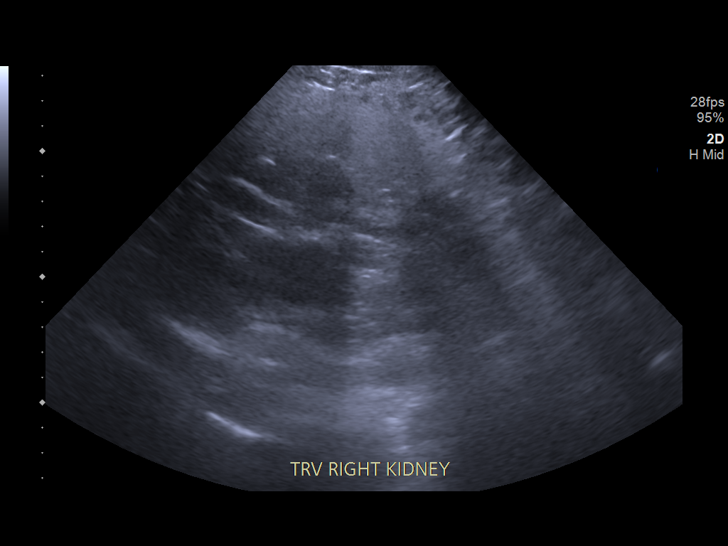
[im 16/30]
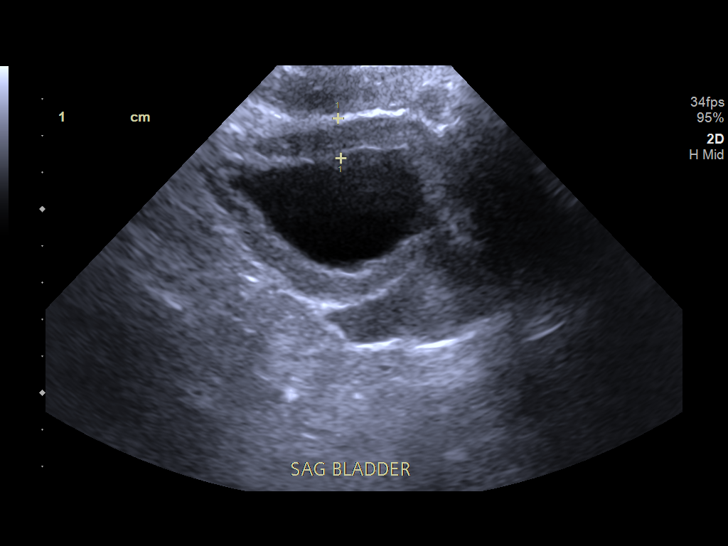
[im 19/30]
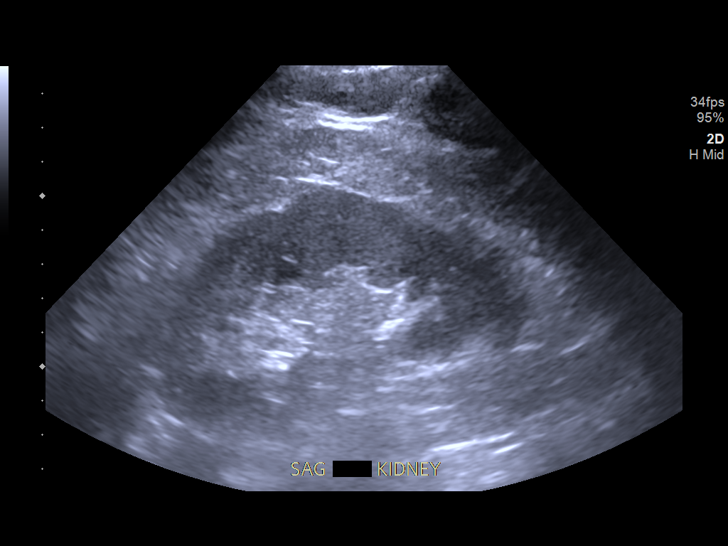
[im 20/30]
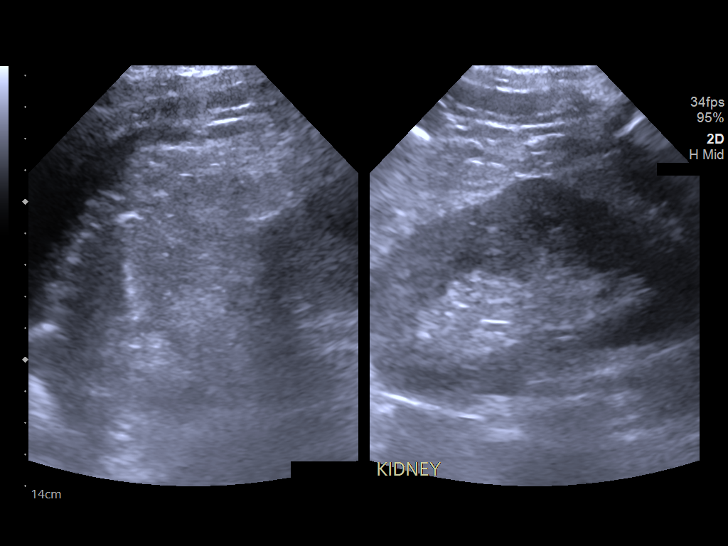
[im 22/30]
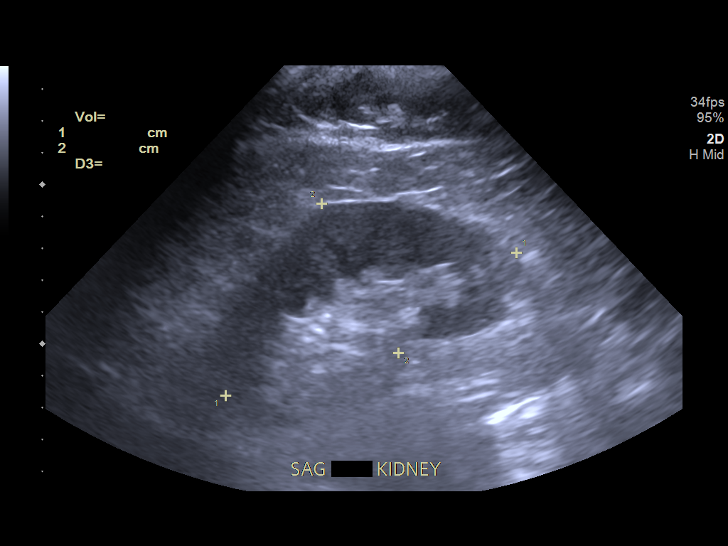
[im 25/30]
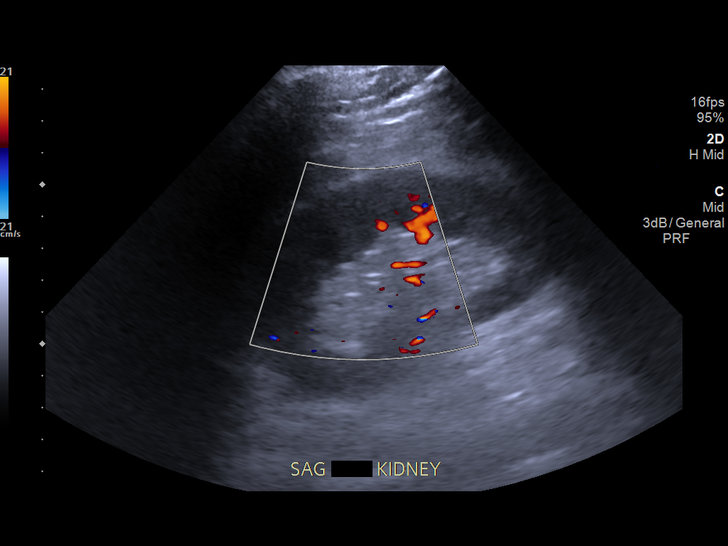
[im 27/30]
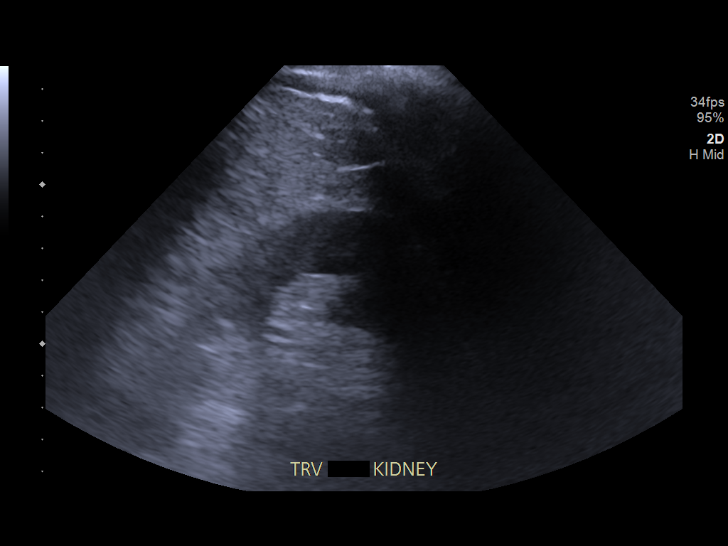
[im 30/30]
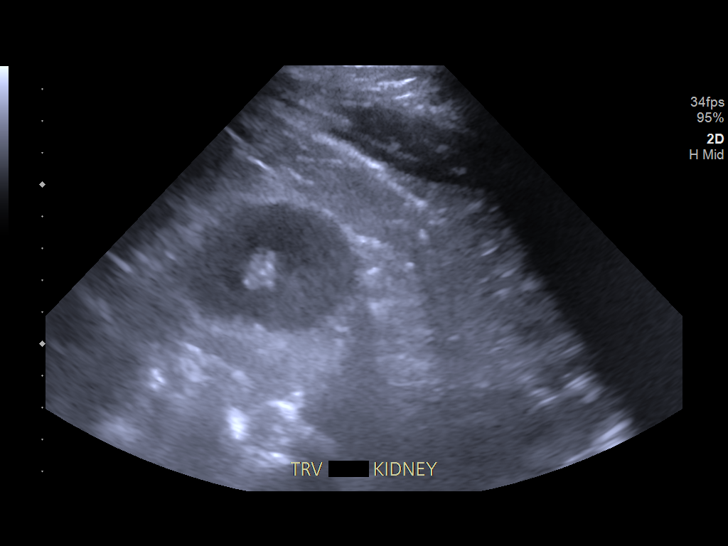

[14 of 25 positions shown; findings below may reference images not displayed]

FINDINGS: Right Kidney:

Renal measurements: 10.8 x 5.4 x 5.7 cm = volume: 173 mL .
Echogenicity within normal limits. No mass or hydronephrosis
visualized.

Left Kidney:

Renal measurements: 10.2 x 5.3 x 6.0 cm = volume: 167 mL.
Echogenicity within normal limits. No mass or hydronephrosis
visualized.

Bladder:

Wall is thickened.  Wall measures 11 mm in thickness.

Other:

None.
IMPRESSION: Thickened urinary bladder wall which could reflect cystitis or a
component of bladder outlet obstruction.

No hydronephrosis.

## 2020-11-26 IMAGING — DX DG CHEST 1V PORT
1 series · 1 of 1 positions shown · non-contrast
Comparison: 12/03/2018

CLINICAL DATA: COVID infection

EXAM:
PORTABLE CHEST 1 VIEW

[chest]
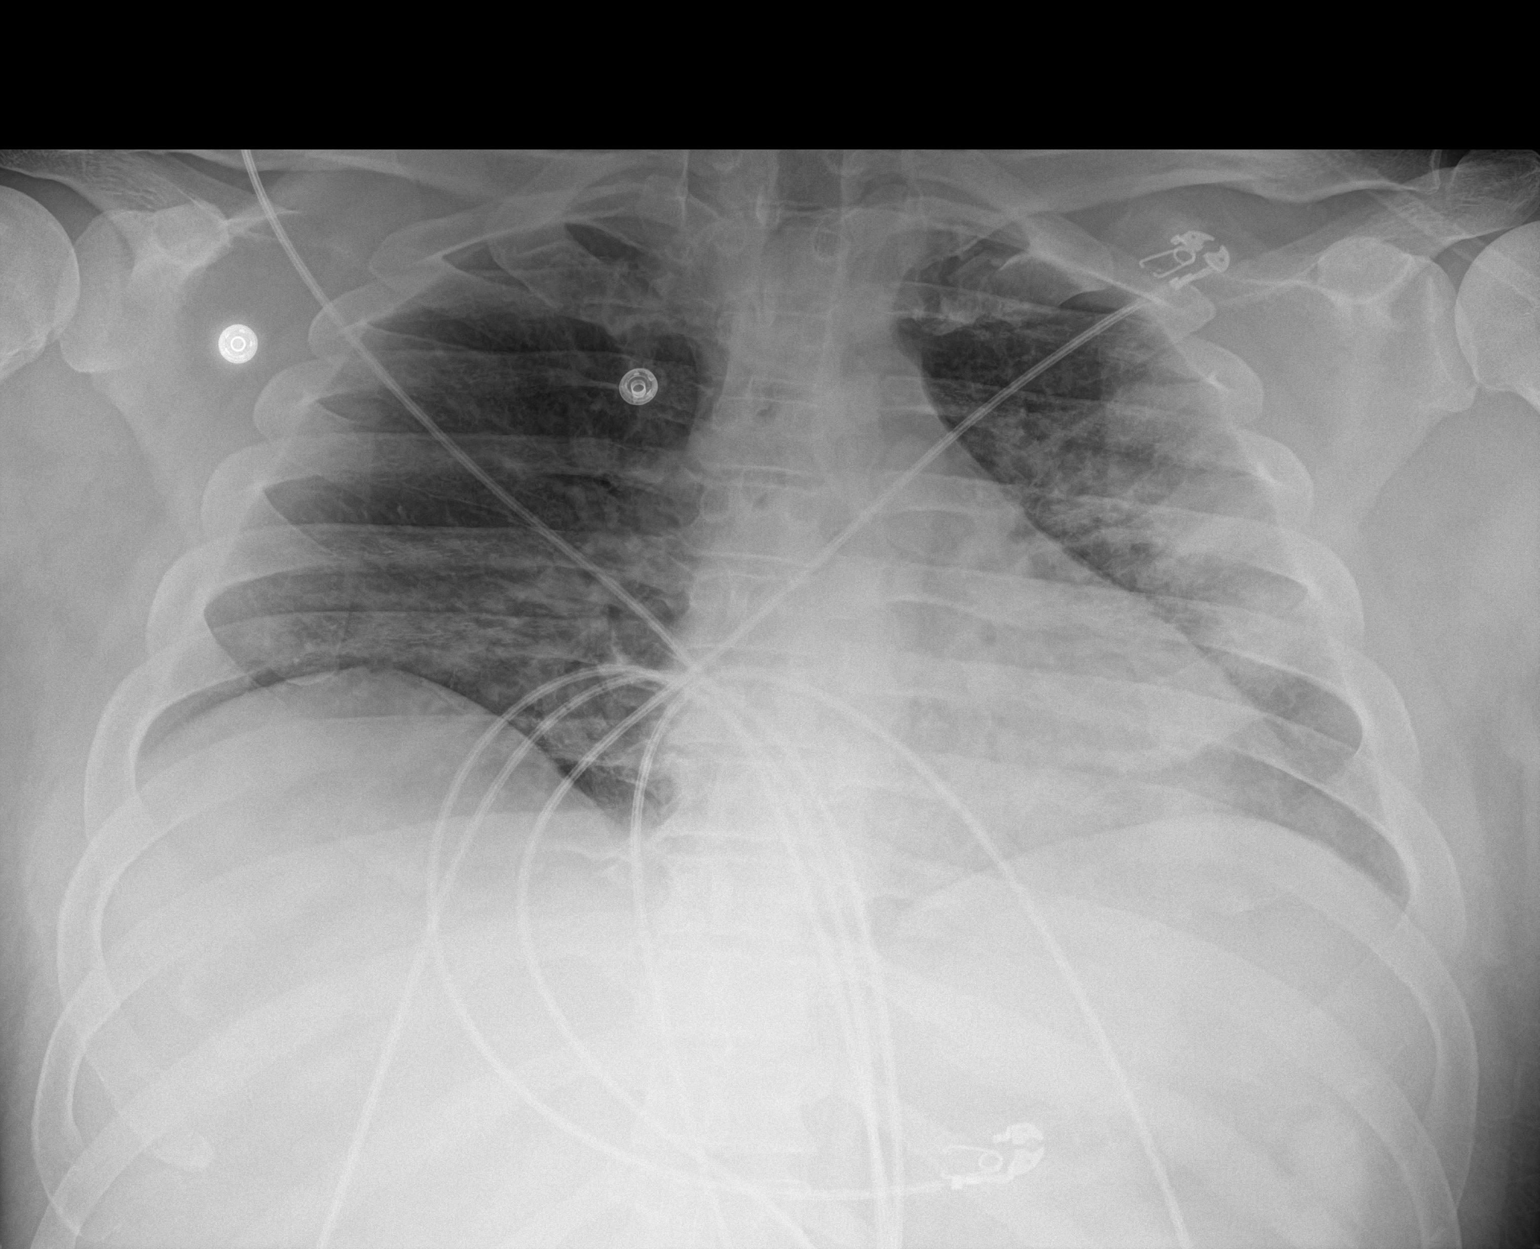

[1 of 1 positions shown; findings below may reference images not displayed]

FINDINGS: Grossly unchanged borderline enlarged cardiac silhouette. Normal
mediastinal contours. Persistently reduced lung volumes with
worsening heterogeneous/consolidative opacities within the left mid
lung. Ill-defined heterogeneous airspace opacities within the right
mid lung are grossly unchanged. No pleural effusion or pneumothorax.
No evidence of edema. No acute osseous abnormalities.
IMPRESSION: Suspected worsening atypical infection with progressive
heterogeneous/consolidative opacities within the left mid lung.
